# Patient Record
Sex: Female | Born: 1955 | Race: White | Hispanic: No | Marital: Married | Smoking: Never smoker
Health system: Southern US, Community
[De-identification: ages and names within clinical notes are randomized; demographics above are authoritative.]

## PROBLEM LIST (undated history)

## (undated) DIAGNOSIS — I10 Essential (primary) hypertension: Secondary | ICD-10-CM

## (undated) DIAGNOSIS — R7303 Prediabetes: Secondary | ICD-10-CM

## (undated) HISTORY — PX: COLONOSCOPY: SHX174

---

## 2008-05-20 ENCOUNTER — Ambulatory Visit: Payer: Self-pay | Admitting: Nurse Practitioner

## 2009-06-19 ENCOUNTER — Ambulatory Visit: Payer: Self-pay | Admitting: Nurse Practitioner

## 2009-06-24 ENCOUNTER — Emergency Department: Payer: Self-pay | Admitting: Emergency Medicine

## 2009-07-21 ENCOUNTER — Ambulatory Visit: Payer: Self-pay | Admitting: Urology

## 2009-11-03 ENCOUNTER — Ambulatory Visit: Payer: Self-pay | Admitting: Urology

## 2009-11-27 ENCOUNTER — Ambulatory Visit: Payer: Self-pay | Admitting: Urology

## 2009-12-22 ENCOUNTER — Ambulatory Visit: Payer: Self-pay | Admitting: Urology

## 2010-05-12 ENCOUNTER — Ambulatory Visit: Payer: Self-pay | Admitting: General Practice

## 2010-10-08 ENCOUNTER — Ambulatory Visit: Payer: Self-pay | Admitting: Nurse Practitioner

## 2011-12-23 ENCOUNTER — Ambulatory Visit: Payer: Self-pay

## 2012-01-05 ENCOUNTER — Ambulatory Visit: Payer: Self-pay

## 2012-03-29 DIAGNOSIS — M810 Age-related osteoporosis without current pathological fracture: Secondary | ICD-10-CM | POA: Insufficient documentation

## 2013-03-09 ENCOUNTER — Ambulatory Visit: Payer: Self-pay | Admitting: Internal Medicine

## 2014-03-12 ENCOUNTER — Ambulatory Visit: Payer: Self-pay | Admitting: Nurse Practitioner

## 2014-04-02 ENCOUNTER — Ambulatory Visit: Payer: Self-pay | Admitting: Gastroenterology

## 2015-03-19 ENCOUNTER — Ambulatory Visit
Admit: 2015-03-19 | Disposition: A | Discharge status: Home / Self Care | Payer: Self-pay | Attending: Nurse Practitioner | Admitting: Nurse Practitioner

## 2016-01-14 ENCOUNTER — Other Ambulatory Visit: Payer: Self-pay | Admitting: Nurse Practitioner

## 2016-01-14 DIAGNOSIS — Z1231 Encounter for screening mammogram for malignant neoplasm of breast: Secondary | ICD-10-CM

## 2016-03-29 ENCOUNTER — Ambulatory Visit
Admission: RE | Admit: 2016-03-29 | Discharge: 2016-03-29 | Disposition: A | Discharge status: Home / Self Care | Payer: Managed Care, Other (non HMO) | Source: Ambulatory Visit | Attending: Nurse Practitioner | Admitting: Nurse Practitioner

## 2016-03-29 DIAGNOSIS — Z1231 Encounter for screening mammogram for malignant neoplasm of breast: Secondary | ICD-10-CM | POA: Diagnosis present

## 2017-01-19 ENCOUNTER — Encounter: Payer: Self-pay | Admitting: Emergency Medicine

## 2017-01-19 ENCOUNTER — Emergency Department
Admission: EM | Admit: 2017-01-19 | Discharge: 2017-01-19 | Disposition: A | Discharge status: Home / Self Care | Payer: BLUE CROSS/BLUE SHIELD | Attending: Emergency Medicine | Admitting: Emergency Medicine

## 2017-01-19 ENCOUNTER — Emergency Department: Payer: BLUE CROSS/BLUE SHIELD

## 2017-01-19 DIAGNOSIS — R0789 Other chest pain: Secondary | ICD-10-CM | POA: Diagnosis not present

## 2017-01-19 DIAGNOSIS — I10 Essential (primary) hypertension: Secondary | ICD-10-CM | POA: Diagnosis not present

## 2017-01-19 DIAGNOSIS — R079 Chest pain, unspecified: Secondary | ICD-10-CM | POA: Diagnosis present

## 2017-01-19 HISTORY — DX: Essential (primary) hypertension: I10

## 2017-01-19 LAB — BASIC METABOLIC PANEL
ANION GAP: 10 (ref 5–15)
BUN: 18 mg/dL (ref 6–20)
CALCIUM: 9.5 mg/dL (ref 8.9–10.3)
CO2: 25 mmol/L (ref 22–32)
Chloride: 102 mmol/L (ref 101–111)
Creatinine, Ser: 0.79 mg/dL (ref 0.44–1.00)
GLUCOSE: 116 mg/dL — AB (ref 65–99)
POTASSIUM: 3.9 mmol/L (ref 3.5–5.1)
SODIUM: 137 mmol/L (ref 135–145)

## 2017-01-19 LAB — CBC
HEMATOCRIT: 38.5 % (ref 35.0–47.0)
HEMOGLOBIN: 13.4 g/dL (ref 12.0–16.0)
MCH: 30.4 pg (ref 26.0–34.0)
MCHC: 34.9 g/dL (ref 32.0–36.0)
MCV: 87.1 fL (ref 80.0–100.0)
Platelets: 428 10*3/uL (ref 150–440)
RBC: 4.42 MIL/uL (ref 3.80–5.20)
RDW: 13.3 % (ref 11.5–14.5)
WBC: 9.2 10*3/uL (ref 3.6–11.0)

## 2017-01-19 LAB — TROPONIN I

## 2017-01-19 MED ORDER — ASPIRIN 81 MG PO CHEW
CHEWABLE_TABLET | ORAL | Status: AC
  Filled 2017-01-19: qty 4

## 2017-01-19 MED ORDER — ASPIRIN 81 MG PO CHEW
324.0000 mg | CHEWABLE_TABLET | Freq: Once | ORAL | Status: AC
  Administered 2017-01-19: 324 mg via ORAL

## 2017-01-19 NOTE — ED Triage Notes (Signed)
Pt presents to ED with c/o L sided chest "heaviness that radiates to her neck" x 1 week. Pt also c/o hypertension at this time. NAD noted, pt is alert and oriented, respirations even and unlabored, pt is noted to be hypertensive on arrival.

## 2017-01-19 NOTE — ED Provider Notes (Signed)
[facility] Emergency Department Provider Note  ____________________________________________   First MD Initiated Contact with Patient 01/19/17 2118     (approximate)  I have reviewed the triage vital signs and the nursing notes.   HISTORY  Chief Complaint Chest Pain    HPI Jamie Griffith is a 62 y.o. female with a medical history only of hypertension which is generally very well controlled and who is quite healthy and fit for her age who presents for evaluation of approximately one week of intermittent nonradiating central chest pain with concurrent elevated blood pressure.  She states that she cannot determine a particular pattern to the chest pain.  In fact a couple of days ago she had a strenuous workout and did not experience any chest pain.  She noticed it today about 45 minutes after eating a relatively spicy lunch.  She notes that the pain is not reproducible and is not exacerbated by movement, but she can actually make the pain go away by pressing firmly on the center of her sternum.  She denies fever/chills, shortness of breath, nausea, vomiting, abdominal pain, dysuria.  She has not had any problems with her heart in the past and does not have a cardiologist.  She has no first degree relatives who have had heart attacks.  She has no history of hyper-dyslipidemia or diabetes.  She has not been on any long trips recently and has never had any blood clots in her legs or lungs and has no personal history of cancer.  She is extremely worried about her blood pressure because she will check her blood pressure when she is having the chest pain and notes that it is been extremely high for her, in the range of the 180s over 90s, when typically her systolic blood pressure is around 115.  Past Medical History:  Diagnosis Date  . HTN (hypertension)     There are no active problems to display for this patient.   Past Surgical History:  Procedure Laterality Date    . CESAREAN SECTION      Prior to Admission medications   Not on File    Allergies Patient has no known allergies.  No family history on file.  Social History Social History  Substance Use Topics  . Smoking status: Never Smoker  . Smokeless tobacco: Never Used  . Alcohol use Yes     Comment: Occ, socially    Review of Systems Constitutional: No fever/chills Eyes: No visual changes. ENT: No sore throat. Cardiovascular: Central anterior chest pain. Respiratory: Denies shortness of breath. Gastrointestinal: No abdominal pain.  No nausea, no vomiting.  No diarrhea.  No constipation. Genitourinary: Negative for dysuria. Musculoskeletal: Negative for back pain. Skin: Negative for rash. Neurological: Negative for headaches, focal weakness or numbness.  10-point ROS otherwise negative.  ____________________________________________   PHYSICAL EXAM:  VITAL SIGNS: ED Triage Vitals  Enc Vitals Group     BP 01/19/17 1736 (!) 180/91     Pulse Rate 01/19/17 1736 97     Resp 01/19/17 1736 18     Temp 01/19/17 1736 98.7 F (37.1 C)     Temp Source 01/19/17 1736 Oral     SpO2 01/19/17 1736 100 %     Weight 01/19/17 1736 110 lb (49.9 kg)     Height 01/19/17 1736 5\' 4"  (1.626 m)     Head Circumference --      Peak Flow --      Pain Score 01/19/17 1737 7  Pain Loc --      Pain Edu? --      Excl. in GC? --     Constitutional: Alert and oriented. Well appearing and in no acute distress. Eyes: Conjunctivae are normal. PERRL. EOMI. Head: Atraumatic. Nose: No congestion/rhinnorhea. Mouth/Throat: Mucous membranes are moist.  Oropharynx non-erythematous. Neck: No stridor.  No meningeal signs.   Cardiovascular: Normal rate, regular rhythm. Good peripheral circulation. Grossly normal heart sounds. Respiratory: Normal respiratory effort.  No retractions. Lungs CTAB. Gastrointestinal: Soft and nontender. No distention.  Musculoskeletal: No lower extremity tenderness nor  edema. No gross deformities of extremities. Neurologic:  Normal speech and language. No gross focal neurologic deficits are appreciated.  Skin:  Skin is warm, dry and intact. No rash noted. Psychiatric: Mood and affect are normal. Speech and behavior are normal.  ____________________________________________   LABS (all labs ordered are listed, but only abnormal results are displayed)  Labs Reviewed  BASIC METABOLIC PANEL - Abnormal; Notable for the following:       Result Value   Glucose, Bld 116 (*)    All other components within normal limits  CBC  TROPONIN I  TROPONIN I   ____________________________________________  EKG  ED ECG REPORT I, Lylie Blacklock, the attending physician, personally viewed and interpreted this ECG.  Date: 01/19/2017 EKG Time: 17:30 Rate: 90 Rhythm: normal sinus rhythm QRS Axis: normal Intervals: normal ST/T Wave abnormalities: Prominent T waves but not indicative of acute ischemia.  No ST segment disturbances.   Conduction Disturbances: none Narrative Interpretation: Non-specific ST segment / T-wave changes, but no evidence of acute ischemia.  ____________________________________________  RADIOLOGY   Dg Chest 2 View  Result Date: 01/19/2017 CLINICAL DATA:  Left-sided chest heaviness, radiating to the neck. Duration 1 week. Hypertension. EXAM: CHEST  2 VIEW COMPARISON:  None. FINDINGS: Mild hyperinflation. The lungs are clear. The pulmonary vasculature is normal. There is no pleural effusion. Heart size is normal. Hilar and mediastinal contours are unremarkable. IMPRESSION: Hyperinflation.  No acute cardiopulmonary findings. Electronically Signed   By: Ellery Plunk M.D.   On: 01/19/2017 18:19    ____________________________________________   PROCEDURES  Procedure(s) performed:   Procedures   Critical Care performed: No ____________________________________________   INITIAL IMPRESSION / ASSESSMENT AND PLAN / ED  COURSE  Pertinent labs & imaging results that were available during my care of the patient were reviewed by me and considered in my medical decision making (see chart for details).  HEART score 3 (low risk), Wells Score for PE is zero.  She had 2 negative troponins.  Her pain completely resolves when placing pressure on her sternum, which is not exactly indicative of musculoskeletal discomfort but is also reassuring in terms of making an acute or emergent, potentially life-threatening medical condition less likely.  She had no pain during a strenuous workout a couple of days ago, and she and her husband both mentioned that her pain started about 45 minutes after eating lunch today.  She has no tenderness to palpation of the right upper quadrant, no nausea or vomiting, and I very much doubt gallbladder disease.  I had an extensive discussion with the patient and her husband about her workup, the role that blood pressure may play (or the fact that the chest pain may be driving up her blood pressure), and why I do not feel that blood pressure intervention is appropriate in the emergency department setting.  I explained that close outpatient cardiology follow-up is appropriate for relatively low risk patients and that  close follow[place]up is very possible in [place].  Her husband is a patient of Dr. Arnoldo HookerBruce Kowalski and they would like to follow up with him, but I also provided the name and number of Dr. Welton FlakesKhan who is frequently able to see patients within 24-48 hours.  I gave my usual and customary return precautions and provided a full dose aspirin.  The patient understands and agrees with the plan.  There is no evidence of acute or emergent medical condition at this time with a low risk of significant morbidity/mortality until she can follow-up as an outpatient.     ____________________________________________  FINAL CLINICAL IMPRESSION(S) / ED DIAGNOSES  Final diagnoses:  Atypical chest pain      MEDICATIONS GIVEN DURING THIS VISIT:  Medications  aspirin 81 MG chewable tablet (not administered)  aspirin chewable tablet 324 mg (324 mg Oral Given 01/19/17 2256)     NEW OUTPATIENT MEDICATIONS STARTED DURING THIS VISIT:  There are no discharge medications for this patient.   There are no discharge medications for this patient.   There are no discharge medications for this patient.    Note:  This document was prepared using Dragon voice recognition software and may include unintentional dictation errors.    Loleta Roseory Ryun Velez, MD 01/19/17 [phone]

## 2017-01-19 NOTE — Discharge Instructions (Signed)

## 2017-01-19 NOTE — ED Notes (Signed)
Pt reports she has had intermittent central chest pain and high BP for over 1 week, Hx of hypertension.

## 2017-02-15 ENCOUNTER — Other Ambulatory Visit: Payer: Self-pay | Admitting: Nurse Practitioner

## 2017-02-15 DIAGNOSIS — Z1231 Encounter for screening mammogram for malignant neoplasm of breast: Secondary | ICD-10-CM

## 2017-03-31 ENCOUNTER — Other Ambulatory Visit: Payer: Self-pay | Admitting: Nurse Practitioner

## 2017-03-31 ENCOUNTER — Ambulatory Visit
Admission: RE | Admit: 2017-03-31 | Discharge: 2017-03-31 | Disposition: A | Discharge status: Home / Self Care | Payer: BLUE CROSS/BLUE SHIELD | Source: Ambulatory Visit | Attending: Nurse Practitioner | Admitting: Nurse Practitioner

## 2017-03-31 DIAGNOSIS — Z1231 Encounter for screening mammogram for malignant neoplasm of breast: Secondary | ICD-10-CM | POA: Diagnosis present

## 2018-02-20 ENCOUNTER — Other Ambulatory Visit: Payer: Self-pay | Admitting: Nurse Practitioner

## 2018-02-20 DIAGNOSIS — Z1231 Encounter for screening mammogram for malignant neoplasm of breast: Secondary | ICD-10-CM

## 2018-04-03 ENCOUNTER — Ambulatory Visit: Payer: BLUE CROSS/BLUE SHIELD

## 2018-04-10 ENCOUNTER — Ambulatory Visit
Admission: RE | Admit: 2018-04-10 | Discharge: 2018-04-10 | Disposition: A | Discharge status: Home / Self Care | Payer: BLUE CROSS/BLUE SHIELD | Source: Ambulatory Visit | Attending: Nurse Practitioner | Admitting: Nurse Practitioner

## 2018-04-10 DIAGNOSIS — Z1231 Encounter for screening mammogram for malignant neoplasm of breast: Secondary | ICD-10-CM

## 2019-03-06 ENCOUNTER — Other Ambulatory Visit: Payer: Self-pay | Admitting: Nurse Practitioner

## 2019-03-06 DIAGNOSIS — Z1231 Encounter for screening mammogram for malignant neoplasm of breast: Secondary | ICD-10-CM

## 2019-06-25 ENCOUNTER — Ambulatory Visit
Admission: RE | Admit: 2019-06-25 | Discharge: 2019-06-25 | Disposition: A | Discharge status: Home / Self Care | Payer: BLUE CROSS/BLUE SHIELD | Source: Ambulatory Visit | Attending: Nurse Practitioner | Admitting: Nurse Practitioner

## 2019-06-25 DIAGNOSIS — Z1231 Encounter for screening mammogram for malignant neoplasm of breast: Secondary | ICD-10-CM | POA: Insufficient documentation

## 2020-04-08 ENCOUNTER — Other Ambulatory Visit: Payer: Self-pay | Admitting: Nurse Practitioner

## 2020-04-08 DIAGNOSIS — Z1231 Encounter for screening mammogram for malignant neoplasm of breast: Secondary | ICD-10-CM

## 2020-08-11 ENCOUNTER — Other Ambulatory Visit: Payer: Self-pay

## 2020-08-11 ENCOUNTER — Ambulatory Visit
Admission: RE | Admit: 2020-08-11 | Discharge: 2020-08-11 | Disposition: A | Discharge status: Home / Self Care | Payer: Medicare Other | Source: Ambulatory Visit | Attending: Nurse Practitioner | Admitting: Nurse Practitioner

## 2020-08-11 DIAGNOSIS — Z1231 Encounter for screening mammogram for malignant neoplasm of breast: Secondary | ICD-10-CM | POA: Diagnosis not present

## 2020-10-06 ENCOUNTER — Ambulatory Visit
Admission: EM | Admit: 2020-10-06 | Discharge: 2020-10-06 | Disposition: A | Discharge status: Home / Self Care | Payer: Medicare Other

## 2020-10-06 ENCOUNTER — Other Ambulatory Visit: Payer: Self-pay

## 2020-10-06 ENCOUNTER — Encounter: Payer: Self-pay | Admitting: Emergency Medicine

## 2020-10-06 DIAGNOSIS — J069 Acute upper respiratory infection, unspecified: Secondary | ICD-10-CM | POA: Diagnosis not present

## 2020-10-06 MED ORDER — BENZONATATE 100 MG PO CAPS: 200.0000 mg | ORAL_CAPSULE | Freq: Three times a day (TID) | ORAL | 0 refills | Status: DC

## 2020-10-06 MED ORDER — PROMETHAZINE-DM 6.25-15 MG/5ML PO SYRP: 5.0000 mL | ORAL_SOLUTION | Freq: Four times a day (QID) | ORAL | 0 refills | Status: DC | PRN

## 2020-10-06 NOTE — ED Provider Notes (Signed)
[facility]    CSN: [id number] Arrival date & time: 10/06/20  0904      History   Chief Complaint Chief Complaint  Patient presents with  . Nasal Congestion  . Headache  . Cough    HPI Jamie Griffith is a 64 y.o. female.    64 year old female presents for evaluation of nasal congestion, headache, and cough x3 days. She denies fever, nasal discharge, sore throat, shortness of breath or wheezing, nausea, vomiting, diarrhea, sick contacts, or positive Covid exposure. She does endorse sinus pressure and sinus headache, ear pressure, and postnasal drip. She is vaccinated against Covid with the full visor series. 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 She takes Zyrtec daily for allergies.     Past Medical History:  Diagnosis Date  . HTN (hypertension)     There are no problems to display for this patient.   Past Surgical History:  Procedure Laterality Date  . CESAREAN SECTION      OB History   No obstetric history on file.      Home Medications    Prior to Admission medications   Medication Sig Start Date End Date Taking? Authorizing Provider  benzonatate (TESSALON) 100 MG capsule Take 2 capsules (200 mg total) by mouth every 8 (eight) hours. 10/06/20   Margarette Canada, NP  losartan-hydrochlorothiazide (HYZAAR) 100-25 MG tablet Take 1 tablet by mouth daily. 09/25/20   [provider]  promethazine-dextromethorphan (PROMETHAZINE-DM) 6.25-15 MG/5ML syrup Take 5 mLs by mouth 4 (four) times daily as needed for cough. 10/06/20   Margarette Canada, NP    Family History Family History  Problem Relation Age of Onset  . Breast cancer Neg Hx     Social History Social History   Tobacco Use  . Smoking status: Never Smoker  . Smokeless tobacco: Never Used  Substance Use Topics  . Alcohol use: Yes    Comment: Occ, socially  . Drug use: No     Allergies   Patient has no known allergies.   Review  of Systems Review of Systems  Constitutional: Negative for activity change, appetite change and fever.  HENT: Positive for congestion, ear pain, sinus pressure and sinus pain. Negative for rhinorrhea, sore throat and tinnitus.   Respiratory: Positive for cough. Negative for shortness of breath and wheezing.   Cardiovascular: Negative for chest pain.  Gastrointestinal: Negative for abdominal pain, diarrhea, nausea and vomiting.  Genitourinary: Negative for dysuria and frequency.  Musculoskeletal: Negative for arthralgias and myalgias.  Skin: Negative for rash.  Neurological: Positive for headaches. Negative for syncope.  Hematological: Negative for adenopathy. Does not bruise/bleed easily.  Psychiatric/Behavioral: Negative.      Physical Exam Triage Vital Signs ED Triage Vitals  Enc Vitals Group     BP 10/06/20 0940 138/87     Pulse Rate 10/06/20 0940 96     Resp 10/06/20 0940 18     Temp 10/06/20 0940 98.8 F (37.1 C)     Temp Source 10/06/20 0940 Oral     SpO2 10/06/20 0940 98 %     Weight 10/06/20 0939 115 lb (52.2 kg)     Height 10/06/20 0939 _0  (1.575 m)     Head Circumference --      Peak Flow --      Pain Score 10/06/20 0938 6     Pain Loc --      Pain Edu? --  Excl. in GC? --    No data found.  Updated Vital Signs BP 138/87 (BP Location: Right Arm)   Pulse 96   Temp 98.8 F (37.1 C) (Oral)   Resp 18   Ht _0  (1.575 m)   Wt 115 lb (52.2 kg)   SpO2 98%   BMI 21.03 kg/m   Visual Acuity Right Eye Distance:   Left Eye Distance:   Bilateral Distance:    Right Eye Near:   Left Eye Near:    Bilateral Near:     Physical Exam Vitals and nursing note reviewed.  Constitutional:      General: She is not in acute distress.    Appearance: She is well-developed and normal weight.  HENT:     Head: Normocephalic and atraumatic.     Right Ear: Hearing, tympanic membrane, ear canal and external ear normal. Tympanic membrane is not injected, perforated or  erythematous.     Left Ear: Hearing, tympanic membrane, ear canal and external ear normal. Tympanic membrane is not injected, perforated or erythematous.     Nose:     Right Turbinates: Swollen. Not pale.     Left Turbinates: Swollen. Not pale.     Right Sinus: No maxillary sinus tenderness or frontal sinus tenderness.     Left Sinus: No maxillary sinus tenderness or frontal sinus tenderness.     Comments: Nasal mucosa reveals mild erythema and edema. Scant clear nasal discharge. No pus noted. No tenderness to percussion of frontal or maxillary sinuses bilaterally.    Mouth/Throat:     Mouth: Mucous membranes are moist.     Comments: Posterior oropharynx is mildly erythematous without injection. There is clear postnasal drip. Eyes:     General: No scleral icterus.    Extraocular Movements: Extraocular movements intact.     Right eye: Normal extraocular motion.     Pupils: Pupils are equal, round, and reactive to light. Pupils are equal.  Cardiovascular:     Rate and Rhythm: Normal rate and regular rhythm.     Heart sounds: Normal heart sounds. No murmur heard.  No gallop.   Pulmonary:     Effort: Pulmonary effort is normal. No respiratory distress.     Breath sounds: Normal breath sounds. No wheezing, rhonchi or rales.  Musculoskeletal:        General: No swelling or tenderness. Normal range of motion.     Cervical back: Normal range of motion and neck supple.  Lymphadenopathy:     Cervical: No cervical adenopathy.  Skin:    General: Skin is warm and dry.     Capillary Refill: Capillary refill takes less than 2 seconds.     Findings: No erythema or rash.  Neurological:     Mental Status: She is alert and oriented to person, place, and time.     GCS: GCS eye subscore is 4. GCS motor subscore is 6.     Cranial Nerves: No cranial nerve deficit.  Psychiatric:        Mood and Affect: Mood normal.        Speech: Speech normal.        Behavior: Behavior normal.      UC Treatments  / Results  Labs (all labs ordered are listed, but only abnormal results are displayed) Labs Reviewed - No data to display  EKG   Radiology No results found.  Procedures Procedures (including critical care time)  Medications Ordered in UC Medications - No data to display  Initial Impression / Assessment and Plan / UC Course  I have reviewed the triage vital signs and the nursing notes.  Pertinent labs & imaging results that were available during my care of the patient were reviewed by me and considered in my medical decision making (see chart for details).   Patient here for evaluation of nasal congestion, frontal/ethmoid headache, and dry cough x3 days. She has had no fever, change in taste or smell, body aches, or other Covid-like symptoms. Patient reports that her symptoms started after she was messing with straw, which she is allergic to.  Physical exam of the nasal mucosa reveals mild erythema and edema. No bluish hue or pale mucous membranes that would be more consistent with allergic rhinitis. No purulent discharge noted in the nares or turbinates.  Will DC home with Tessalon Perles and Promethazine DM for cough, have patient continue her antihistamine, add sinus irrigation to her regimen.  Final Clinical Impressions(s) / UC Diagnoses   Final diagnoses:  Viral URI with cough     Discharge Instructions     Continue your home antihistamine.   When using your Flonase, follow each 2 actuations per nostril with 1 spray of saline nasal spray. This will help to push the particles up to your turbinates where they will take effect.  Perform sinus irrigation with a [employer] sinus rinse kit and warmed distilled water. Warm distilled water in a sauce pan and tested to the back of your wrist. The water should be warm and should not sting. Perform the sinus irrigation 2-3 times a day. Wait 20 minutes after doing sinus irrigation in the evening before using your nasal steroid.  Use  the Tessalon Perles every 8 hours during the day as needed for cough. Take them with a small sip of water. He experienced some numbness to the base of the tongue or a metallic taste in her mouth this is normal. Use the promethazine DM syrup at bedtime for cough and congestion.  Use over-the-counter Tylenol and ibuprofen as needed for pain and fever.    ED Prescriptions    Medication Sig Dispense Auth. Provider   benzonatate (TESSALON) 100 MG capsule Take 2 capsules (200 mg total) by mouth every 8 (eight) hours. 21 capsule Margarette Canada, NP   promethazine-dextromethorphan (PROMETHAZINE-DM) 6.25-15 MG/5ML syrup Take 5 mLs by mouth 4 (four) times daily as needed for cough. 118 mL Margarette Canada, NP     PDMP not reviewed this encounter.   Margarette Canada, NP 10/06/20 1016

## 2020-10-06 NOTE — Discharge Instructions (Addendum)
Continue your home antihistamine.   When using your Flonase, follow each 2 actuations per nostril with 1 spray of saline nasal spray. This will help to push the particles up to your turbinates where they will take effect.  Perform sinus irrigation with a [employer] sinus rinse kit and warmed distilled water. Warm distilled water in a sauce pan and tested to the back of your wrist. The water should be warm and should not sting. Perform the sinus irrigation 2-3 times a day. Wait 20 minutes after doing sinus irrigation in the evening before using your nasal steroid.  Use the Tessalon Perles every 8 hours during the day as needed for cough. Take them with a small sip of water. He experienced some numbness to the base of the tongue or a metallic taste in her mouth this is normal. Use the promethazine DM syrup at bedtime for cough and congestion.  Use over-the-counter Tylenol and ibuprofen as needed for pain and fever.

## 2020-10-06 NOTE — ED Triage Notes (Signed)
Patient c/o nasal congestion, headaches, cough that started 3 days ago.

## 2021-04-23 ENCOUNTER — Other Ambulatory Visit: Payer: Self-pay | Admitting: Nurse Practitioner

## 2021-04-23 DIAGNOSIS — Z1231 Encounter for screening mammogram for malignant neoplasm of breast: Secondary | ICD-10-CM

## 2021-07-19 ENCOUNTER — Ambulatory Visit
Admission: EM | Admit: 2021-07-19 | Discharge: 2021-07-19 | Disposition: A | Discharge status: Home / Self Care | Payer: Medicare Other | Attending: Orthopedic Surgery | Admitting: Orthopedic Surgery

## 2021-07-19 ENCOUNTER — Other Ambulatory Visit: Payer: Self-pay

## 2021-07-19 DIAGNOSIS — N3000 Acute cystitis without hematuria: Secondary | ICD-10-CM | POA: Diagnosis present

## 2021-07-19 LAB — POCT URINALYSIS DIP (DEVICE)
Bilirubin Urine: NEGATIVE
Glucose, UA: NEGATIVE mg/dL
Hgb urine dipstick: NEGATIVE
Ketones, ur: NEGATIVE mg/dL
Nitrite: NEGATIVE
Protein, ur: NEGATIVE mg/dL
Specific Gravity, Urine: 1.02 (ref 1.005–1.030)
Urobilinogen, UA: 0.2 mg/dL (ref 0.0–1.0)
pH: 7.5 (ref 5.0–8.0)

## 2021-07-19 MED ORDER — CEPHALEXIN 500 MG PO CAPS: 500.0000 mg | ORAL_CAPSULE | Freq: Four times a day (QID) | ORAL | 0 refills | Status: AC

## 2021-07-19 NOTE — ED Triage Notes (Signed)
Patient states that she has been having sharp left lower sided back pain x 2 days. States that she has also noticed bloating. Reports that she has a history of kidney stones and this feels similar.

## 2021-07-19 NOTE — ED Provider Notes (Signed)
[facility]    CSN: [id number] Arrival date & time: 07/19/21  1128      History   Chief Complaint Chief Complaint  Patient presents with   Back Pain    Left lower    HPI Jamie Griffith is a 65 y.o. female presents to the urgent care facility for evaluation of left flank pain, bloating sensation.  Symptoms have been present for 2 days.  No trauma or injury.  She initially thought it was muscular back pain but she does not feel any spasms, tightness or tenderness to touch.  She use muscle creams with no relief.  She has noticed decreased urinary frequency but no burning or hematuria.  She denies any abdominal pain but does have some bloating.  She is passing some gas.  Bowels have been regular.  No nausea, fevers, vomiting.  She does have a history of kidney stones.  HPI  Past Medical History:  Diagnosis Date   HTN (hypertension)     There are no problems to display for this patient.   Past Surgical History:  Procedure Laterality Date   CESAREAN SECTION      OB History   No obstetric history on file.      Home Medications    Prior to Admission medications   Medication Sig Start Date End Date Taking? Authorizing Provider  cephALEXin (KEFLEX) 500 MG capsule Take 1 capsule (500 mg total) by mouth 4 (four) times daily for 7 days. 07/19/21 07/26/21 Yes Evon Slack, PA-C  losartan-hydrochlorothiazide (HYZAAR) 100-25 MG tablet Take 1 tablet by mouth daily. 09/25/20  Yes [provider]  benzonatate (TESSALON) 100 MG capsule Take 2 capsules (200 mg total) by mouth every 8 (eight) hours. 10/06/20   Becky Augusta, NP  promethazine-dextromethorphan (PROMETHAZINE-DM) 6.25-15 MG/5ML syrup Take 5 mLs by mouth 4 (four) times daily as needed for cough. 10/06/20   Becky Augusta, NP    Family History Family History  Problem Relation Age of Onset   Breast cancer Neg Hx     Social History Social History   Tobacco Use   Smoking status: Never   Smokeless  tobacco: Never  Substance Use Topics   Alcohol use: Yes    Comment: Occ, socially   Drug use: No     Allergies   Patient has no known allergies.   Review of Systems Review of Systems  Constitutional:  Negative for fever.  Respiratory:  Negative for shortness of breath.   Cardiovascular:  Negative for chest pain.  Gastrointestinal:  Negative for abdominal pain, nausea and vomiting.  Genitourinary:  Positive for flank pain. Negative for difficulty urinating, dysuria, frequency and urgency.  Musculoskeletal:  Negative for back pain and myalgias.  Skin:  Negative for rash.  Neurological:  Negative for dizziness and headaches.    Physical Exam Triage Vital Signs ED Triage Vitals  Enc Vitals Group     BP 07/19/21 1138 (!) 151/66     Pulse Rate 07/19/21 1138 99     Resp 07/19/21 1138 18     Temp 07/19/21 1138 99.2 F (37.3 C)     Temp Source 07/19/21 1138 Oral     SpO2 07/19/21 1138 100 %     Weight 07/19/21 1137 112 lb (50.8 kg)     Height 07/19/21 1137 5\' 2"  (1.575 m)     Head Circumference --      Peak Flow --      Pain Score 07/19/21 1137 4  Pain Loc --      Pain Edu? --      Excl. in GC? --    No data found.  Updated Vital Signs BP (!) 151/66 (BP Location: Right Arm)   Pulse 99   Temp 99.2 F (37.3 C) (Oral)   Resp 18   Ht 5\' 2"  (1.575 m)   Wt 112 lb (50.8 kg)   SpO2 100%   BMI 20.49 kg/m   Visual Acuity Right Eye Distance:   Left Eye Distance:   Bilateral Distance:    Right Eye Near:   Left Eye Near:    Bilateral Near:     Physical Exam Constitutional:      Appearance: She is well-developed.  HENT:     Head: Normocephalic and atraumatic.  Eyes:     Conjunctiva/sclera: Conjunctivae normal.  Cardiovascular:     Rate and Rhythm: Normal rate.     Pulses: Normal pulses.     Heart sounds: Normal heart sounds.  Pulmonary:     Effort: Pulmonary effort is normal. No respiratory distress.  Abdominal:     General: Abdomen is flat. There is no  distension.     Palpations: Abdomen is soft. There is no mass.     Tenderness: There is no abdominal tenderness. There is left CVA tenderness. There is no guarding or rebound.     Hernia: No hernia is present.     Comments: Mild left CVA tenderness with percussion.  No muscular tenderness and no spinous process tenderness, facet or sacral tenderness.  No muscle spasms noted.  Musculoskeletal:        General: Normal range of motion.     Cervical back: Normal range of motion.  Skin:    General: Skin is warm.     Findings: No rash.  Neurological:     Mental Status: She is alert and oriented to person, place, and time.  Psychiatric:        Behavior: Behavior normal.        Thought Content: Thought content normal.     UC Treatments / Results  Labs (all labs ordered are listed, but only abnormal results are displayed) Labs Reviewed  POCT URINALYSIS DIP (DEVICE) - Abnormal; Notable for the following components:      Result Value   Leukocytes,Ua TRACE (*)    All other components within normal limits  URINE CULTURE  POCT URINALYSIS DIPSTICK, ED / UC    EKG   Radiology No results found.  Procedures Procedures (including critical care time)  Medications Ordered in UC Medications - No data to display  Initial Impression / Assessment and Plan / UC Course  I have reviewed the triage vital signs and the nursing notes.  Pertinent labs & imaging results that were available during my care of the patient were reviewed by me and considered in my medical decision making (see chart for details).     65 year old female with left lower back pain and pelvic pressure/bloating.  Vital signs are stable, slight low-grade temp.  No nausea, vomiting.  Pain does not seem to mimic her previous kidney stones.  No blood in urine.  Leukocytes found and dipstick urinalysis concerning for possible UTI.  Will place on Keflex and.  Urine culture pending.  Patient understands signs symptoms return to the ER  for. Final Clinical Impressions(s) / UC Diagnoses   Final diagnoses:  Acute cystitis without hematuria     Discharge Instructions      Take antibiotics as  prescribed.  Make sure you are drinking lots of fluids.  Return to the ER for any worsening symptoms or changes in health.     ED Prescriptions     Medication Sig Dispense Auth. Provider   cephALEXin (KEFLEX) 500 MG capsule Take 1 capsule (500 mg total) by mouth 4 (four) times daily for 7 days. 28 capsule Evon Slack, PA-C      PDMP not reviewed this encounter.   Evon Slack, [place] 07/19/21 1207

## 2021-07-19 NOTE — Discharge Instructions (Addendum)
Take antibiotics as prescribed.  Make sure you are drinking lots of fluids.  Return to the ER for any worsening symptoms or changes in health.

## 2021-07-21 LAB — URINE CULTURE
Culture: <10000 — AB
Special Requests: NORMAL

## 2021-08-27 ENCOUNTER — Ambulatory Visit
Admission: RE | Admit: 2021-08-27 | Discharge: 2021-08-27 | Disposition: A | Discharge status: Home / Self Care | Payer: Medicare Other | Source: Ambulatory Visit | Attending: Nurse Practitioner | Admitting: Nurse Practitioner

## 2021-08-27 ENCOUNTER — Other Ambulatory Visit: Payer: Self-pay

## 2021-08-27 DIAGNOSIS — Z1231 Encounter for screening mammogram for malignant neoplasm of breast: Secondary | ICD-10-CM | POA: Insufficient documentation

## 2022-02-16 IMAGING — MG MM DIGITAL SCREENING BILAT W/ TOMO AND CAD
6 of 10 series · 6 of 30 positions shown · non-contrast
Comparison: Previous exam(s).

CLINICAL DATA: Screening.

EXAM:
DIGITAL SCREENING BILATERAL MAMMOGRAM WITH TOMOSYNTHESIS AND CAD
TECHNIQUE: Bilateral screening digital craniocaudal and mediolateral oblique
mammograms were obtained. Bilateral screening digital breast
tomosynthesis was performed. The images were evaluated with
computer-aided detection.

[L MLO synth-2D (1 of 2)]
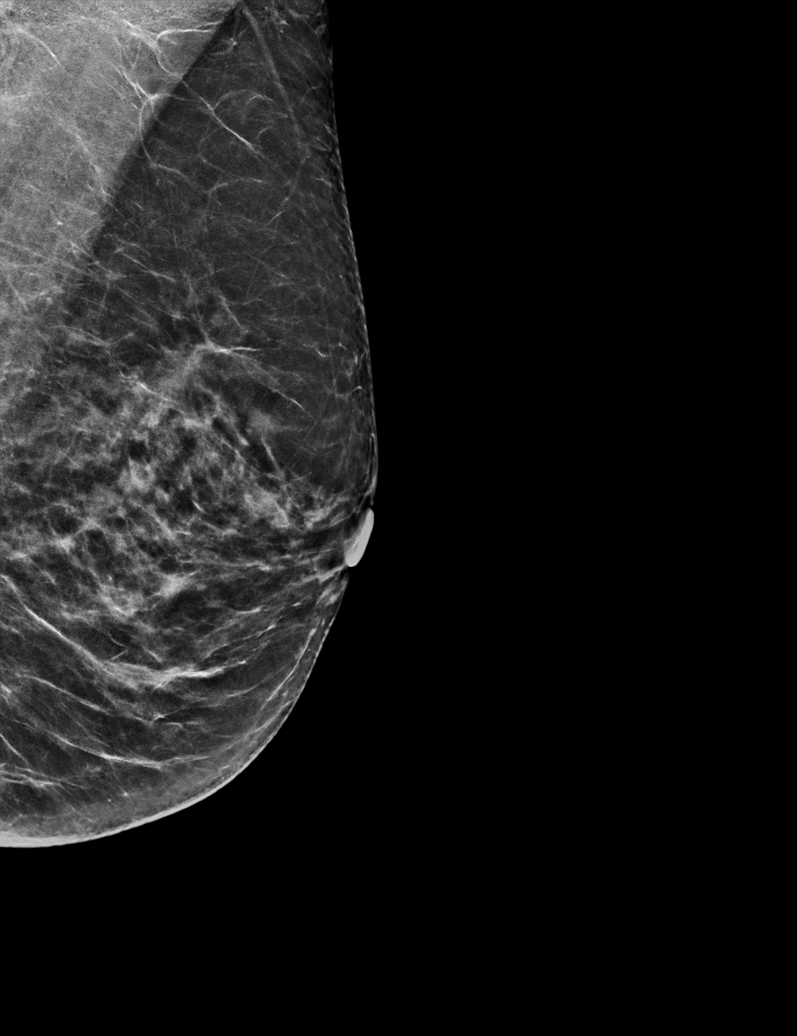

[R CC synth-2D]
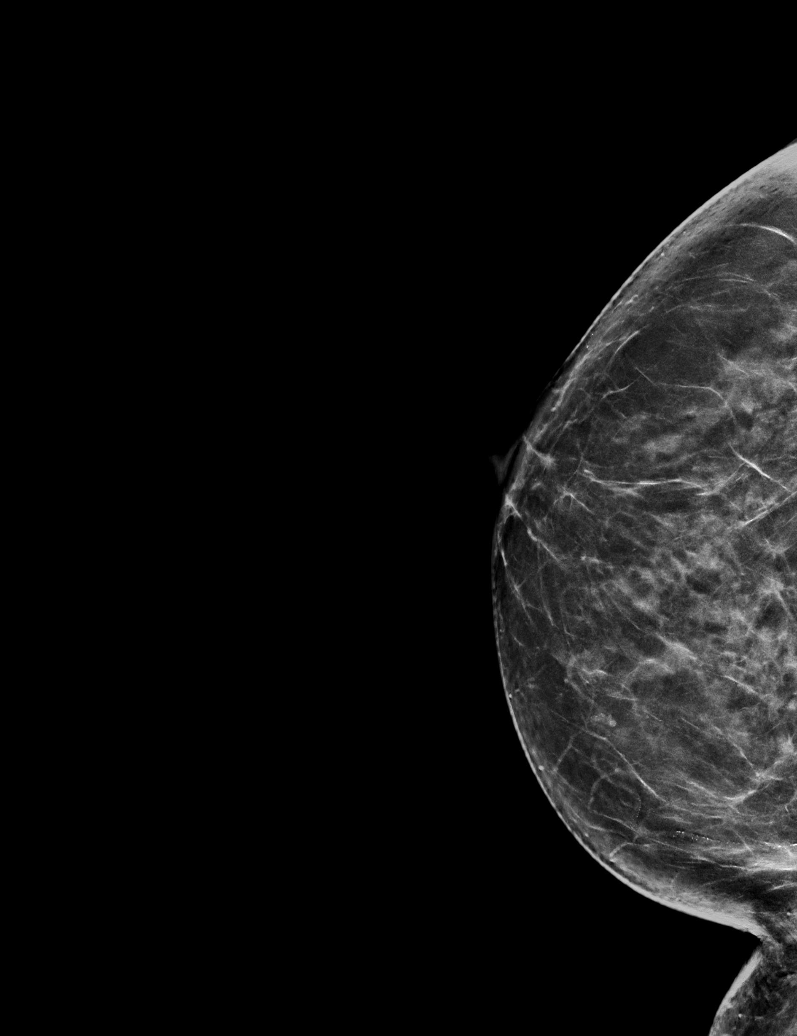

[R MLO synth-2D]
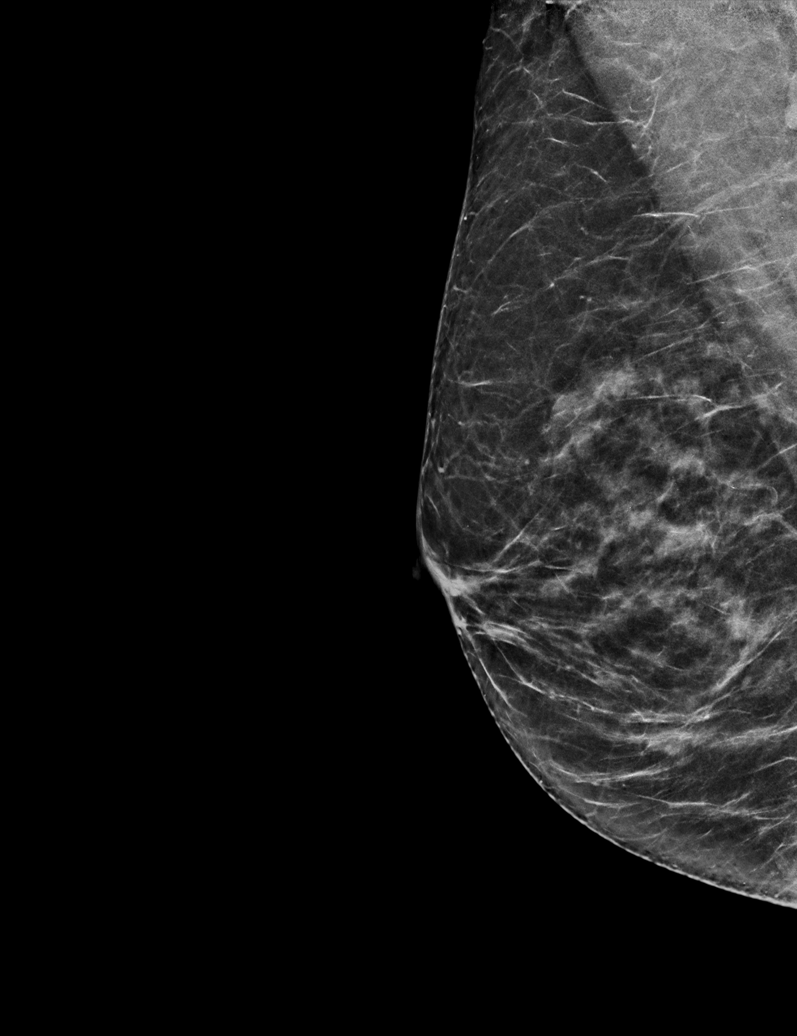

[L MLO synth-2D (2 of 2)]
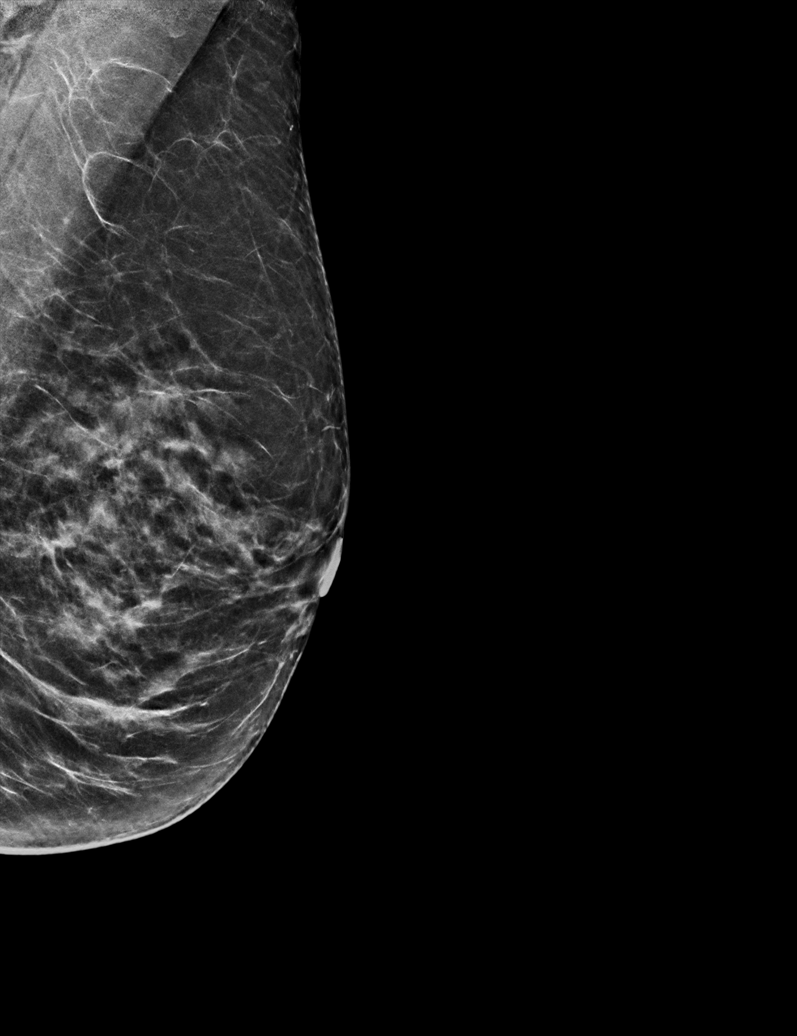

[L CC synth-2D]
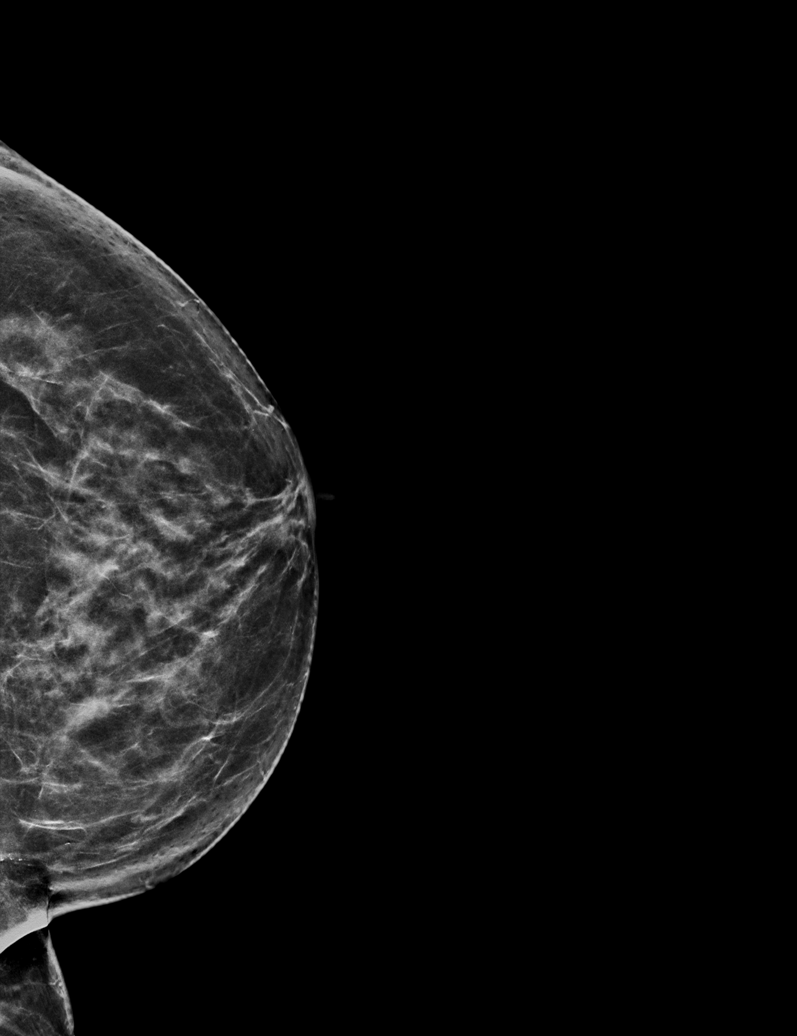

[L MLO tomo · tomo slice 29/58.0]
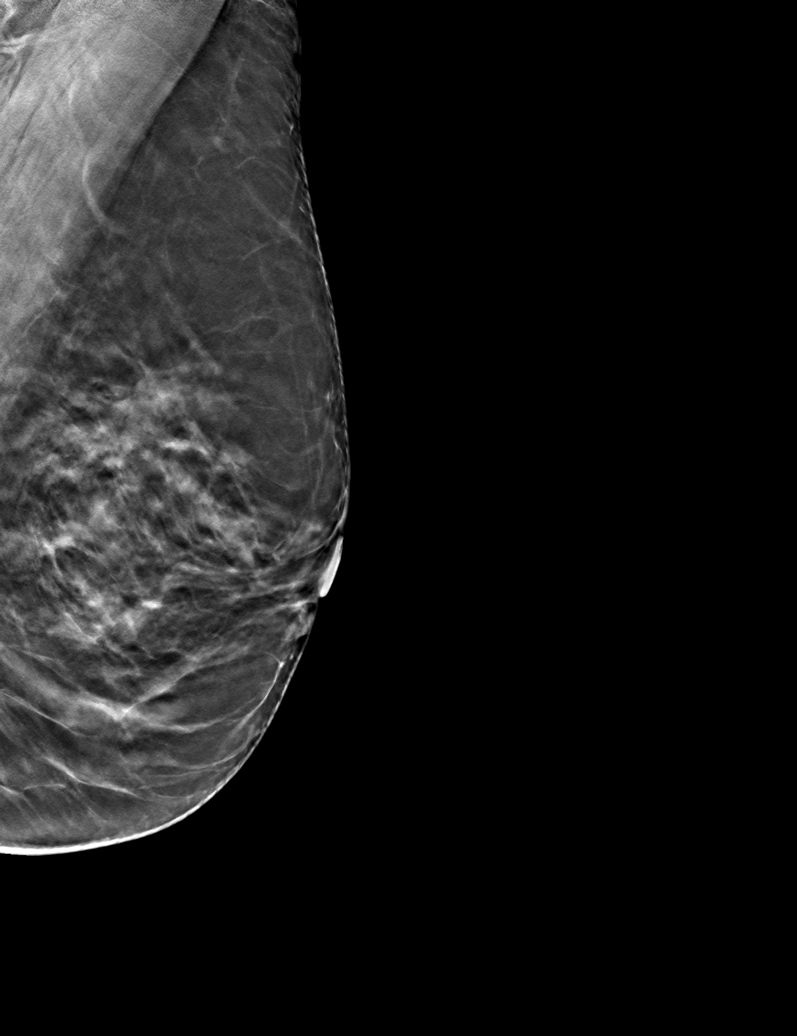

[6 of 30 positions shown; findings below may reference images not displayed]

ACR Breast Density Category c: The breast tissue is heterogeneously
dense, which may obscure small masses.
FINDINGS: There are no findings suspicious for malignancy.
IMPRESSION: No mammographic evidence of malignancy. A result letter of this
screening mammogram will be mailed directly to the patient.

RECOMMENDATION:
Screening mammogram in one year. (Code:[id number]-W-BC3)

BI-RADS CATEGORY  1: Negative.

## 2022-07-19 ENCOUNTER — Other Ambulatory Visit: Payer: Self-pay | Admitting: Nurse Practitioner

## 2022-07-19 DIAGNOSIS — Z1231 Encounter for screening mammogram for malignant neoplasm of breast: Secondary | ICD-10-CM

## 2022-08-27 ENCOUNTER — Other Ambulatory Visit: Payer: Self-pay | Admitting: Orthopedic Surgery

## 2022-08-27 DIAGNOSIS — S46001D Unspecified injury of muscle(s) and tendon(s) of the rotator cuff of right shoulder, subsequent encounter: Secondary | ICD-10-CM

## 2022-08-27 DIAGNOSIS — M25311 Other instability, right shoulder: Secondary | ICD-10-CM

## 2022-08-27 DIAGNOSIS — G8929 Other chronic pain: Secondary | ICD-10-CM

## 2022-08-31 ENCOUNTER — Ambulatory Visit
Admission: RE | Admit: 2022-08-31 | Discharge: 2022-08-31 | Disposition: A | Discharge status: Home / Self Care | Payer: Medicare Other | Source: Ambulatory Visit | Attending: Nurse Practitioner | Admitting: Nurse Practitioner

## 2022-08-31 DIAGNOSIS — Z1231 Encounter for screening mammogram for malignant neoplasm of breast: Secondary | ICD-10-CM | POA: Diagnosis present

## 2022-09-15 ENCOUNTER — Ambulatory Visit
Admission: RE | Admit: 2022-09-15 | Discharge: 2022-09-15 | Disposition: A | Discharge status: Home / Self Care | Payer: Medicare Other | Source: Ambulatory Visit | Attending: Orthopedic Surgery | Admitting: Orthopedic Surgery

## 2022-09-15 DIAGNOSIS — S46001D Unspecified injury of muscle(s) and tendon(s) of the rotator cuff of right shoulder, subsequent encounter: Secondary | ICD-10-CM

## 2022-09-15 DIAGNOSIS — G8929 Other chronic pain: Secondary | ICD-10-CM

## 2022-09-15 DIAGNOSIS — M25311 Other instability, right shoulder: Secondary | ICD-10-CM

## 2022-10-06 ENCOUNTER — Other Ambulatory Visit: Payer: Self-pay | Admitting: Orthopedic Surgery

## 2022-12-31 ENCOUNTER — Encounter
Admission: RE | Admit: 2022-12-31 | Discharge: 2022-12-31 | Disposition: A | Discharge status: Home / Self Care | Payer: Medicare Other | Source: Ambulatory Visit | Attending: Orthopedic Surgery | Admitting: Orthopedic Surgery

## 2022-12-31 ENCOUNTER — Other Ambulatory Visit: Payer: Self-pay

## 2022-12-31 ENCOUNTER — Other Ambulatory Visit: Payer: Medicare Other

## 2022-12-31 VITALS — Ht 63.5 in | Wt 110.0 lb

## 2022-12-31 DIAGNOSIS — Z79899 Other long term (current) drug therapy: Secondary | ICD-10-CM

## 2022-12-31 DIAGNOSIS — I10 Essential (primary) hypertension: Secondary | ICD-10-CM

## 2022-12-31 HISTORY — DX: Prediabetes: R73.03

## 2022-12-31 NOTE — Patient Instructions (Signed)
Your procedure is scheduled on: 01/07/23 Report to DAY SURGERY DEPARTMENT LOCATED ON 2ND FLOOR MEDICAL MALL ENTRANCE. To find out your arrival time please call [phone] between 1PM - 3PM on 01/06/23.  Remember: Instructions that are not followed completely may result in serious medical risk, up to and including death, or upon the discretion of your surgeon and anesthesiologist your surgery may need to be rescheduled.     _X__ 1. Do not eat food after midnight the night before your procedure.                 No gum chewing or hard candies. You may drink clear liquids up to 2 hours                 before you are scheduled to arrive for your surgery- DO not drink clear                 liquids within 2 hours of the start of your surgery.                 Clear Liquids include:  water, apple juice without pulp, clear carbohydrate                 drink such as Clearfast or Gatorade, Black Coffee or Tea (Do not add                 anything to coffee or tea). Diabetics water only  Drink the Ensure "clear" pre surgery drink 2 hours before arrival to surgery.  __X__2.  On the morning of surgery brush your teeth with toothpaste and water, you                 may rinse your mouth with mouthwash if you wish.  Do not swallow any              toothpaste of mouthwash.     _X__ 3.  No Alcohol for 24 hours before or after surgery.   _X__ 4.  Do Not Smoke or use e-cigarettes For 24 Hours Prior to Your Surgery.                 Do not use any chewable tobacco products for at least 6 hours prior to                 surgery.  ____  5.  Bring all medications with you on the day of surgery if instructed.   __X__  6.  Notify your doctor if there is any change in your medical condition      (cold, fever, infections).     Do not wear jewelry, make-up, hairpins, clips or nail polish. Do not wear lotions, powders, or perfumes. No Deodorant Do not shave body hair 48 hours prior to surgery. Men may shave face and  neck. Do not bring valuables to the hospital.    [facility] is not responsible for any belongings or valuables.  Contacts, dentures/partials or body piercings may not be worn into surgery. Bring a case for your contacts, glasses or hearing aids, a denture cup will be supplied. Leave your suitcase in the car. After surgery it may be brought to your room. For patients admitted to the hospital, discharge time is determined by your treatment team.   Patients discharged the day of surgery will need an adult driver. You will not be allowed to drive yourself home, take an uber, lyft or taxi. You may use  medical transportation such as [place] as long as you have an adult with you or waiting to assist you upon your arrival home. You must have an adult over 27 years old in the home with you for the first 24 hours after surgery   Please read over the following fact sheets that you were given:   CHG soap, Incentive Spirometer, Ensure  __X__ Take these medicines the morning of surgery with A SIP OF WATER:    1. cetirizine (ZYRTEC) 10 MG tablet   2.   3.   4.  5.  6.  ____ Fleet Enema (as directed)   __X__ Use CHG Soap/SAGE wipes as directed  ____ Use inhalers on the day of surgery  ____ Stop metformin/Janumet/Farxiga 2 days prior to surgery    ____ Take 1/2 of usual insulin dose the night before surgery. No insulin the morning          of surgery.   ____ Stop Blood Thinners Coumadin/Plavix/Xarelto/Pleta/Pradaxa/Eliquis/Effient/Aspirin  on   Or contact your Surgeon, Cardiologist or Medical Doctor regarding  ability to stop your blood thinners  __X__ Stop Anti-inflammatories 7 days before surgery such as Advil, Ibuprofen, Motrin,  BC or Goodies Powder, Naprosyn, Naproxen, Aleve, Aspirin    __X__ Stop all herbals and supplements, fish oil or vitamins  until after surgery.    ____ Bring C-Pap to the hospital.    Hold Asprin starting today 12/31/22. If already taken today start  Saturday 01/01/23.      Preparing for Surgery with CHLORHEXIDINE GLUCONATE (CHG) Soap  Chlorhexidine Gluconate (CHG) Soap  o An antiseptic cleaner that kills germs and bonds with the skin to continue killing germs even after washing  o Used for showering the night before surgery and morning of surgery  Before surgery, you can play an important role by reducing the number of germs on your skin.  CHG (Chlorhexidine gluconate) soap is an antiseptic cleanser which kills germs and bonds with the skin to continue killing germs even after washing.  Please do not use if you have an allergy to CHG or antibacterial soaps. If your skin becomes reddened/irritated stop using the CHG.  1. Shower the NIGHT BEFORE SURGERY and the MORNING OF SURGERY with CHG soap.  2. If you choose to wash your hair, wash your hair first as usual with your normal shampoo.  3. After shampooing, rinse your hair and body thoroughly to remove the shampoo.  4. Use CHG as you would any other liquid soap. You can apply CHG directly to the skin and wash gently with a scrungie or a clean washcloth.  5. Apply the CHG soap to your body only from the neck down. Do not use on open wounds or open sores. Avoid contact with your eyes, ears, mouth, and genitals (private parts). Wash face and genitals (private parts) with your normal soap.  6. Wash thoroughly, paying special attention to the area where your surgery will be performed.  7. Thoroughly rinse your body with warm water.  8. Do not shower/wash with your normal soap after using and rinsing off the CHG soap.  9. Pat yourself dry with a clean towel.  10. Wear clean pajamas to bed the night before surgery.  12. Place clean sheets on your bed the night of your first shower and do not sleep with pets.  13. Shower again with the CHG soap on the day of surgery prior to arriving at the hospital.  14. Do not apply any  deodorants/lotions/powders.  15. Please wear clean  clothes to the hospital.

## 2023-01-03 ENCOUNTER — Encounter
Admission: RE | Admit: 2023-01-03 | Discharge: 2023-01-03 | Disposition: A | Discharge status: Home / Self Care | Payer: Medicare Other | Source: Ambulatory Visit | Attending: Orthopedic Surgery | Admitting: Orthopedic Surgery

## 2023-01-03 ENCOUNTER — Encounter: Payer: Self-pay | Admitting: Urgent Care

## 2023-01-03 DIAGNOSIS — Z79899 Other long term (current) drug therapy: Secondary | ICD-10-CM | POA: Insufficient documentation

## 2023-01-03 DIAGNOSIS — I10 Essential (primary) hypertension: Secondary | ICD-10-CM | POA: Diagnosis not present

## 2023-01-03 DIAGNOSIS — I517 Cardiomegaly: Secondary | ICD-10-CM | POA: Diagnosis not present

## 2023-01-03 DIAGNOSIS — Z01818 Encounter for other preprocedural examination: Secondary | ICD-10-CM | POA: Diagnosis present

## 2023-01-03 LAB — BASIC METABOLIC PANEL
Anion gap: 8 (ref 5–15)
BUN: 18 mg/dL (ref 8–23)
CO2: 26 mmol/L (ref 22–32)
Calcium: 8.9 mg/dL (ref 8.9–10.3)
Chloride: 103 mmol/L (ref 98–111)
Creatinine, Ser: 0.83 mg/dL (ref 0.44–1.00)
GFR, Estimated: >60 mL/min (ref >60)
Glucose, Bld: 102 mg/dL — ABNORMAL HIGH (ref 70–99)
Potassium: 3.5 mmol/L (ref 3.5–5.1)
Sodium: 137 mmol/L (ref 135–145)

## 2023-01-03 LAB — CBC
HCT: 40.3 % (ref 36.0–46.0)
Hemoglobin: 13.5 g/dL (ref 12.0–15.0)
MCH: 30 pg (ref 26.0–34.0)
MCHC: 33.5 g/dL (ref 30.0–36.0)
MCV: 89.6 fL (ref 80.0–100.0)
Platelets: 460 10*3/uL — ABNORMAL HIGH (ref 150–400)
RBC: 4.5 MIL/uL (ref 3.87–5.11)
RDW: 13.1 % (ref 11.5–15.5)
WBC: 6.6 10*3/uL (ref 4.0–10.5)
nRBC: 0 % (ref 0.0–0.2)

## 2023-01-06 MED ORDER — ORAL CARE MOUTH RINSE: 15.0000 mL | Freq: Once | OROMUCOSAL | Status: AC

## 2023-01-06 MED ORDER — LACTATED RINGERS IV SOLN: INTRAVENOUS | Status: DC

## 2023-01-06 MED ORDER — CHLORHEXIDINE GLUCONATE 0.12 % MT SOLN: 15.0000 mL | Freq: Once | OROMUCOSAL | Status: AC

## 2023-01-06 MED ORDER — FAMOTIDINE 20 MG PO TABS: 20.0000 mg | ORAL_TABLET | Freq: Once | ORAL | Status: AC

## 2023-01-06 MED ORDER — CEFAZOLIN SODIUM-DEXTROSE 2-4 GM/100ML-% IV SOLN
2.0000 g | INTRAVENOUS | Status: AC
  Administered 2023-01-07: 2 g via INTRAVENOUS

## 2023-01-07 ENCOUNTER — Other Ambulatory Visit: Payer: Self-pay

## 2023-01-07 ENCOUNTER — Encounter
Admission: RE | Disposition: A | Discharge status: Home / Self Care | Payer: Self-pay | Source: Home / Self Care | Attending: Orthopedic Surgery

## 2023-01-07 ENCOUNTER — Ambulatory Visit: Payer: Medicare Other | Admitting: Certified Registered"

## 2023-01-07 ENCOUNTER — Ambulatory Visit
Admission: RE | Admit: 2023-01-07 | Discharge: 2023-01-07 | Disposition: A | Discharge status: Home / Self Care | Payer: Medicare Other | Attending: Orthopedic Surgery | Admitting: Orthopedic Surgery

## 2023-01-07 ENCOUNTER — Encounter: Payer: Self-pay | Admitting: Orthopedic Surgery

## 2023-01-07 ENCOUNTER — Ambulatory Visit: Payer: Medicare Other

## 2023-01-07 DIAGNOSIS — I10 Essential (primary) hypertension: Secondary | ICD-10-CM | POA: Insufficient documentation

## 2023-01-07 DIAGNOSIS — M75101 Unspecified rotator cuff tear or rupture of right shoulder, not specified as traumatic: Secondary | ICD-10-CM | POA: Insufficient documentation

## 2023-01-07 DIAGNOSIS — M19011 Primary osteoarthritis, right shoulder: Secondary | ICD-10-CM | POA: Diagnosis not present

## 2023-01-07 DIAGNOSIS — M25811 Other specified joint disorders, right shoulder: Secondary | ICD-10-CM | POA: Diagnosis present

## 2023-01-07 HISTORY — PX: SHOULDER ARTHROSCOPY WITH SUBACROMIAL DECOMPRESSION AND OPEN ROTATOR C: SHX5688

## 2023-01-07 SURGERY — SHOULDER ARTHROSCOPY WITH SUBACROMIAL DECOMPRESSION AND OPEN ROTATOR CUFF REPAIR, OPEN BICEPS TENDON REPAIR
Anesthesia: General | Site: Shoulder | Laterality: Right

## 2023-01-07 MED ORDER — ONDANSETRON HCL 4 MG/2ML IJ SOLN
INTRAMUSCULAR | Status: DC | PRN
  Administered 2023-01-07: 4 mg via INTRAVENOUS

## 2023-01-07 MED ORDER — SUGAMMADEX SODIUM 200 MG/2ML IV SOLN
INTRAVENOUS | Status: DC | PRN
  Administered 2023-01-07: 200 mg via INTRAVENOUS

## 2023-01-07 MED ORDER — ACETAMINOPHEN 500 MG PO TABS: 1000.0000 mg | ORAL_TABLET | Freq: Three times a day (TID) | ORAL | 2 refills | Status: AC

## 2023-01-07 MED ORDER — OXYCODONE HCL 5 MG PO TABS: 5.0000 mg | ORAL_TABLET | Freq: Once | ORAL | Status: DC | PRN

## 2023-01-07 MED ORDER — PROPOFOL 10 MG/ML IV BOLUS
INTRAVENOUS | Status: DC | PRN
  Administered 2023-01-07: 130 mg via INTRAVENOUS

## 2023-01-07 MED ORDER — BUPIVACAINE HCL (PF) 0.5 % IJ SOLN
INTRAMUSCULAR | Status: AC
  Filled 2023-01-07: qty 60

## 2023-01-07 MED ORDER — LACTATED RINGERS IR SOLN
Status: DC | PRN
  Administered 2023-01-07 (×8): 3000 mL

## 2023-01-07 MED ORDER — DEXAMETHASONE SODIUM PHOSPHATE 10 MG/ML IJ SOLN
INTRAMUSCULAR | Status: DC | PRN
  Administered 2023-01-07: 10 mg via INTRAVENOUS

## 2023-01-07 MED ORDER — LIDOCAINE HCL (PF) 1 % IJ SOLN
INTRAMUSCULAR | Status: AC
  Filled 2023-01-07: qty 5

## 2023-01-07 MED ORDER — OXYCODONE HCL 5 MG PO TABS: 5.0000 mg | ORAL_TABLET | ORAL | 0 refills | Status: AC | PRN

## 2023-01-07 MED ORDER — BUPIVACAINE LIPOSOME 1.3 % IJ SUSP
INTRAMUSCULAR | Status: AC
  Filled 2023-01-07: qty 10

## 2023-01-07 MED ORDER — EPINEPHRINE PF 1 MG/ML IJ SOLN
INTRAMUSCULAR | Status: AC
  Filled 2023-01-07: qty 8

## 2023-01-07 MED ORDER — LIDOCAINE HCL (PF) 2 % IJ SOLN
INTRAMUSCULAR | Status: AC
  Filled 2023-01-07: qty 5

## 2023-01-07 MED ORDER — CHLORHEXIDINE GLUCONATE 0.12 % MT SOLN
OROMUCOSAL | Status: AC
  Administered 2023-01-07: 15 mL via OROMUCOSAL
  Filled 2023-01-07: qty 15

## 2023-01-07 MED ORDER — PROPOFOL 10 MG/ML IV BOLUS
INTRAVENOUS | Status: AC
  Filled 2023-01-07: qty 20

## 2023-01-07 MED ORDER — LIDOCAINE HCL (CARDIAC) PF 100 MG/5ML IV SOSY
PREFILLED_SYRINGE | INTRAVENOUS | Status: DC | PRN
  Administered 2023-01-07: 60 mg via INTRAVENOUS

## 2023-01-07 MED ORDER — CEFAZOLIN SODIUM-DEXTROSE 2-4 GM/100ML-% IV SOLN
INTRAVENOUS | Status: AC
  Filled 2023-01-07: qty 100

## 2023-01-07 MED ORDER — PHENYLEPHRINE HCL-NACL 20-0.9 MG/250ML-% IV SOLN
INTRAVENOUS | Status: AC
  Filled 2023-01-07: qty 250

## 2023-01-07 MED ORDER — FENTANYL CITRATE (PF) 100 MCG/2ML IJ SOLN
INTRAMUSCULAR | Status: AC
  Filled 2023-01-07: qty 2

## 2023-01-07 MED ORDER — MIDAZOLAM HCL 2 MG/2ML IJ SOLN
INTRAMUSCULAR | Status: AC
  Administered 2023-01-07: 2 mg via INTRAVENOUS
  Filled 2023-01-07: qty 2

## 2023-01-07 MED ORDER — ONDANSETRON 4 MG PO TBDP: 4.0000 mg | ORAL_TABLET | Freq: Three times a day (TID) | ORAL | 0 refills | Status: AC | PRN

## 2023-01-07 MED ORDER — BUPIVACAINE LIPOSOME 1.3 % IJ SUSP
INTRAMUSCULAR | Status: DC | PRN
  Administered 2023-01-07: 10 mL

## 2023-01-07 MED ORDER — BUPIVACAINE HCL (PF) 0.5 % IJ SOLN
INTRAMUSCULAR | Status: DC | PRN
  Administered 2023-01-07: 10 mL

## 2023-01-07 MED ORDER — DEXAMETHASONE SODIUM PHOSPHATE 10 MG/ML IJ SOLN
INTRAMUSCULAR | Status: AC
  Filled 2023-01-07: qty 1

## 2023-01-07 MED ORDER — PHENYLEPHRINE HCL-NACL 20-0.9 MG/250ML-% IV SOLN
INTRAVENOUS | Status: DC | PRN
  Administered 2023-01-07: 25.467 ug/min via INTRAVENOUS

## 2023-01-07 MED ORDER — BUPIVACAINE HCL (PF) 0.5 % IJ SOLN
INTRAMUSCULAR | Status: AC
  Filled 2023-01-07: qty 10

## 2023-01-07 MED ORDER — ASPIRIN 325 MG PO TBEC: 325.0000 mg | DELAYED_RELEASE_TABLET | Freq: Every day | ORAL | 0 refills | Status: AC

## 2023-01-07 MED ORDER — LACTATED RINGERS IR SOLN
Status: DC | PRN
  Administered 2023-01-07 (×4): 3001 mL

## 2023-01-07 MED ORDER — MIDAZOLAM HCL 2 MG/2ML IJ SOLN: 2.0000 mg | Freq: Once | INTRAMUSCULAR | Status: AC

## 2023-01-07 MED ORDER — ACETAMINOPHEN 10 MG/ML IV SOLN
INTRAVENOUS | Status: DC | PRN
  Administered 2023-01-07: 1000 mg via INTRAVENOUS

## 2023-01-07 MED ORDER — HYDROMORPHONE HCL 1 MG/ML IJ SOLN: 0.2500 mg | INTRAMUSCULAR | Status: DC | PRN

## 2023-01-07 MED ORDER — ROCURONIUM BROMIDE 100 MG/10ML IV SOLN
INTRAVENOUS | Status: DC | PRN
  Administered 2023-01-07 (×2): 10 mg via INTRAVENOUS
  Administered 2023-01-07: 30 mg via INTRAVENOUS

## 2023-01-07 MED ORDER — FAMOTIDINE 20 MG PO TABS
ORAL_TABLET | ORAL | Status: AC
  Administered 2023-01-07: 20 mg via ORAL
  Filled 2023-01-07: qty 1

## 2023-01-07 MED ORDER — OXYCODONE HCL 5 MG/5ML PO SOLN: 5.0000 mg | Freq: Once | ORAL | Status: DC | PRN

## 2023-01-07 MED ORDER — ROCURONIUM BROMIDE 10 MG/ML (PF) SYRINGE
PREFILLED_SYRINGE | INTRAVENOUS | Status: AC
  Filled 2023-01-07: qty 10

## 2023-01-07 MED ORDER — FENTANYL CITRATE (PF) 100 MCG/2ML IJ SOLN
INTRAMUSCULAR | Status: DC | PRN
  Administered 2023-01-07: 50 ug via INTRAVENOUS

## 2023-01-07 MED ORDER — ACETAMINOPHEN 10 MG/ML IV SOLN
INTRAVENOUS | Status: AC
  Filled 2023-01-07: qty 100

## 2023-01-07 MED ORDER — LIDOCAINE HCL (PF) 1 % IJ SOLN
INTRAMUSCULAR | Status: DC | PRN
  Administered 2023-01-07: 3 mL

## 2023-01-07 MED ORDER — ONDANSETRON HCL 4 MG/2ML IJ SOLN
INTRAMUSCULAR | Status: AC
  Filled 2023-01-07: qty 2

## 2023-01-07 SURGICAL SUPPLY — 93 items
ADAPTER IRRIG TUBE 2 SPIKE SOL (ADAPTER) ×2 IMPLANT
ADH SKN CLS APL DERMABOND .7 (GAUZE/BANDAGES/DRESSINGS) ×1
ADPR TBG 2 SPK PMP STRL ASCP (ADAPTER) ×2
ANCH SUT .5 CRC TPR CT 40X40 (SUTURE) ×1
ANCH SUT 1.4 SUT TPE BLK/WHT (SUTURE) ×1
ANCH SUT 2 SWLK 19.1 CLS EYLT (Anchor) ×3 IMPLANT
ANCH SUT 2.9 PUSHLOCK ANCH (Orthopedic Implant) ×1 IMPLANT
ANCH SUT 2X2.3 TAPE (Anchor) ×2 IMPLANT
ANCH SUT SHRT 12.5 CANN EYLT (Anchor) ×1 IMPLANT
ANCHOR 2.3 SP SGL 1.2 XBRAID (Anchor) IMPLANT
ANCHOR SUT BIOCOMP LK 2.9X12.5 (Anchor) IMPLANT
ANCHOR SWIVELOCK BIO 4.75X19.1 (Anchor) ×1 IMPLANT
APL PRP STRL LF DISP 70% ISPRP (MISCELLANEOUS) ×1
BLADE SHAVER 4.5X7 STR FR (MISCELLANEOUS) ×1 IMPLANT
BNDG ADH 2 X3.75 FABRIC TAN LF (GAUZE/BANDAGES/DRESSINGS) ×1 IMPLANT
BNDG ADH XL 3.75X2 STRCH LF (GAUZE/BANDAGES/DRESSINGS) ×1
BUR BR 5.5 WIDE MOUTH (BURR) IMPLANT
CANNULA PART THRD DISP 5.75X7 (CANNULA) IMPLANT
CANNULA PARTIAL THREAD 2X7 (CANNULA) IMPLANT
CANNULA TWIST IN 8.25X7CM (CANNULA) IMPLANT
CANNULA TWIST IN 8.25X9CM (CANNULA) IMPLANT
CHLORAPREP W/TINT 26 (MISCELLANEOUS) ×1 IMPLANT
COOLER POLAR GLACIER W/PUMP (MISCELLANEOUS) ×1 IMPLANT
DERMABOND ADVANCED .7 DNX12 (GAUZE/BANDAGES/DRESSINGS) IMPLANT
DEVICE SUCT BLK HOLE OR FLOOR (MISCELLANEOUS) ×1 IMPLANT
DRAPE 3/4 80X56 (DRAPES) ×1 IMPLANT
DRAPE INCISE IOBAN 66X45 STRL (DRAPES) ×1 IMPLANT
DRAPE U-SHAPE 47X51 STRL (DRAPES) ×2 IMPLANT
DRSG TEGADERM 4X4.75 (GAUZE/BANDAGES/DRESSINGS) ×3 IMPLANT
ELECT REM PT RETURN 9FT ADLT (ELECTROSURGICAL) ×1
ELECTRODE REM PT RTRN 9FT ADLT (ELECTROSURGICAL) ×1 IMPLANT
GAUZE SPONGE 4X4 12PLY STRL (GAUZE/BANDAGES/DRESSINGS) ×1 IMPLANT
GAUZE XEROFORM 1X8 LF (GAUZE/BANDAGES/DRESSINGS) ×1 IMPLANT
GLOVE BIO SURGEON STRL SZ7.5 (GLOVE) ×1 IMPLANT
GLOVE BIOGEL PI IND STRL 8 (GLOVE) ×2 IMPLANT
GLOVE SURG ORTHO 8.0 STRL STRW (GLOVE) ×1 IMPLANT
GLOVE SURG SYN 7.5  E (GLOVE) ×1
GLOVE SURG SYN 7.5 E (GLOVE) ×1 IMPLANT
GLOVE SURG SYN 7.5 PF PI (GLOVE) IMPLANT
GLOVE SURG SYN 8.0 (GLOVE) ×1 IMPLANT
GLOVE SURG SYN 8.0 PF PI (GLOVE) ×1 IMPLANT
GOWN STRL REUS W/ TWL LRG LVL3 (GOWN DISPOSABLE) ×2 IMPLANT
GOWN STRL REUS W/TWL LRG LVL3 (GOWN DISPOSABLE) ×3
GOWN STRL REUS W/TWL XL LVL4 (GOWN DISPOSABLE) ×1 IMPLANT
IV LACTATED RINGER IRRG 3000ML (IV SOLUTION) ×12
IV LR IRRIG 3000ML ARTHROMATIC (IV SOLUTION) ×4 IMPLANT
KIT CORKSCREW KNTLS 3.9 S/T/P (INSTRUMENTS) IMPLANT
KIT STABILIZATION SHOULDER (MISCELLANEOUS) ×1 IMPLANT
KIT SUTURETAK 3.0 INSERT PERC (KITS) IMPLANT
KIT TURNOVER KIT A (KITS) ×1 IMPLANT
MANIFOLD NEPTUNE II (INSTRUMENTS) ×2 IMPLANT
MASK FACE SPIDER DISP (MASK) ×1 IMPLANT
MAT ABSORB  FLUID 56X50 GRAY (MISCELLANEOUS) ×2
MAT ABSORB FLUID 56X50 GRAY (MISCELLANEOUS) ×2 IMPLANT
NDL MAYO CATGUT SZ5 (NEEDLE)
NDL SAFETY ECLIP 18X1.5 (MISCELLANEOUS) ×1 IMPLANT
NDL SCORPION MULTI FIRE (NEEDLE) IMPLANT
NDL SUT 5 .5 CRC TPR PNT MAYO (NEEDLE) IMPLANT
NEEDLE SCORPION MULTI FIRE (NEEDLE) IMPLANT
PACK ARTHROSCOPY SHOULDER (MISCELLANEOUS) ×1 IMPLANT
PAD ABD DERMACEA PRESS 5X9 (GAUZE/BANDAGES/DRESSINGS) IMPLANT
PAD ARMBOARD 7.5X6 YLW CONV (MISCELLANEOUS) ×1 IMPLANT
PAD WRAPON POLAR SHDR XLG (MISCELLANEOUS) ×1 IMPLANT
PASSER SUT FIRSTPASS SELF (INSTRUMENTS) ×1 IMPLANT
PASSER SUT SWIFTSTITCH HIP CRT (INSTRUMENTS) ×1 IMPLANT
SHAVER BLADE BONE CUTTER  5.5 (BLADE)
SHAVER BLADE BONE CUTTER 5.5 (BLADE) ×1 IMPLANT
SLEEVE REMOTE CONTROL 5X12 (DRAPES) IMPLANT
SLING ULTRA II M (MISCELLANEOUS) ×1 IMPLANT
SPONGE T-LAP 18X18 ~~LOC~~+RFID (SPONGE) ×1 IMPLANT
STAPLER SKIN PROX 35W (STAPLE) IMPLANT
STRAP SAFETY 5IN WIDE (MISCELLANEOUS) ×1 IMPLANT
SUT ETHILON 3-0 (SUTURE) ×1 IMPLANT
SUT LASSO 90 DEG CVD (SUTURE) IMPLANT
SUT LASSO 90 DEG SD STR (SUTURE) IMPLANT
SUT MNCRL 4-0 (SUTURE) ×1
SUT MNCRL 4-0 27XMFL (SUTURE) ×1
SUT PROLENE 0 CT 2 (SUTURE) IMPLANT
SUT VIC AB 0 CT1 36 (SUTURE) IMPLANT
SUT VIC AB 2-0 CT2 27 (SUTURE) IMPLANT
SUT XBRAID 1.4 BLK/WHT (SUTURE) IMPLANT
SUT XBRAID 1.4 BLUE (SUTURE) IMPLANT
SUTURE MNCRL 4-0 27XMF (SUTURE) IMPLANT
SYSTEM IMPL TENODESIS LNT 2.9 (Orthopedic Implant) IMPLANT
TAPE CLOTH 3X10 WHT NS LF (GAUZE/BANDAGES/DRESSINGS) ×1 IMPLANT
TAPE MICROFOAM 4IN (TAPE) ×1 IMPLANT
TRAP FLUID SMOKE EVACUATOR (MISCELLANEOUS) ×1 IMPLANT
TUBING CONNECTING 10 (TUBING) IMPLANT
TUBING INFLOW SET DBFLO PUMP (TUBING) ×1 IMPLANT
TUBING OUTFLOW SET DBLFO PUMP (TUBING) ×1 IMPLANT
WAND WEREWOLF FLOW 90D (MISCELLANEOUS) ×1 IMPLANT
WATER STERILE IRR 500ML POUR (IV SOLUTION) ×1 IMPLANT
WRAPON POLAR PAD SHDR XLG (MISCELLANEOUS) ×1

## 2023-01-07 NOTE — Transfer of Care (Signed)
Immediate Anesthesia Transfer of Care Note  Patient: Jamie Griffith  Procedure(s) Performed: Right shoulder arthroscopic rotator cuff repair (supraspinatus and subscapularis), distal clavicle excision, and biceps tenodesis (Right: Shoulder)  Patient Location: PACU  Anesthesia Type:General  Level of Consciousness: awake, alert , and drowsy  Airway & Oxygen Therapy: Patient Spontanous Breathing and Patient connected to face mask oxygen  Post-op Assessment: Report given to RN and Post -op Vital signs reviewed and stable  Post vital signs: stable  Last Vitals:  Vitals Value Taken Time  BP 142/80 01/07/23 1021  Temp    Pulse 71 01/07/23 1025  Resp 18 01/07/23 1025  SpO2 100 % 01/07/23 1025  Vitals shown include unvalidated device data.  Last Pain:  Vitals:   01/07/23 0613  TempSrc: Temporal  PainSc: 4       Patients Stated Pain Goal: 0 (74/08/14 4818)  Complications: No notable events documented.

## 2023-01-07 NOTE — H&P (Signed)
Paper H&P to be scanned into permanent record. H&P reviewed. No significant changes noted.  

## 2023-01-07 NOTE — Discharge Instructions (Addendum)
POLAR CARE INFORMATION  [email or web address]  How to use [facility] Cold Therapy System?  YouTube   [email or web address]  OPERATING INSTRUCTIONS  Start the product With dry hands, connect the transformer to the electrical connection located on the top of the cooler. Next, plug the transformer into an appropriate electrical outlet. The unit will automatically start running at this point.  To stop the pump, disconnect electrical power.  Unplug to stop the product when not in use. Unplugging the Polar Care unit turns it off. Always unplug immediately after use. Never leave it plugged in while unattended. Remove pad.    FIRST ADD WATER TO FILL LINE, THEN ICE---Replace ice when existing ice is almost melted  1 Discuss Treatment with your [facility] Practitioner and Use Only as Prescribed 2 Apply Insulation Barrier & Cold Therapy Pad 3 Check for Moisture 4 Inspect Skin Regularly  Tips and Trouble Shooting Usage Tips 1. Use cubed or chunked ice for optimal performance. 2. It is recommended to drain the Pad between uses. To drain the pad, hold the Pad upright with the hose pointed toward the ground. Depress the black plunger and allow water to drain out. 3. You may disconnect the Pad from the unit without removing the pad from the affected area by depressing the silver tabs on the hose coupling and gently pulling the hoses apart. The Pad and unit will seal itself and will not leak. Note: Some dripping during release is normal. 4. DO NOT RUN PUMP WITHOUT WATER! The pump in this unit is designed to run with water. Running the unit without water will cause permanent damage to the pump. 5. Unplug unit before removing lid.  TROUBLESHOOTING GUIDE Pump not running, Water not flowing to the pad, Pad is not getting cold 1. Make sure the transformer is plugged into the wall outlet. 2. Confirm that the ice and water are filled to the indicated levels. 3. Make sure  there are no kinks in the pad. 4. Gently pull on the blue tube to make sure the tube/pad junction is straight. 5. Remove the pad from the treatment site and ll it while the pad is lying at; then reapply. 6. Confirm that the pad couplings are securely attached to the unit. Listen for the double clicks (Figure 1) to confirm the pad couplings are securely attached.  Leaks    Note: Some condensation on the lines, controller, and pads is unavoidable, especially in warmer climates. 1. If using a [facility] Polar Care Cold Therapy unit with a detachable Cold Therapy Pad, and a leak exists (other than condensation on the lines) disconnect the pad couplings. Make sure the silver tabs on the couplings are depressed before reconnecting the pad to the pump hose; then confirm both sides of the coupling are properly clicked in. 2. If the coupling continues to leak or a leak is detected in the pad itself, stop using it and call [facility] at [phone].  Cleaning After use, empty and dry the unit with a soft cloth. Warm water and mild detergent may be used occasionally to clean the pump and tubes.  WARNING: [place] can be cold enough to cause serious injury, including full skin necrosis. Follow these Operating Instructions, and carefully read the Product Insert (see pouch on side of unit) and the Cold Therapy Pad Fitting Instructions (provided with each Cold Therapy Pad) prior to use.       Post-Op Instructions - Rotator Cuff Repair  1.  Bracing: You will wear a shoulder immobilizer or sling for 6 weeks.   2. Driving: No driving for 3 weeks post-op. When driving, do not wear the immobilizer. Ideally, we recommend no driving for 6 weeks while sling is in place as one arm will be immobilized.   3. Activity: No active lifting for 2 months. Wrist, hand, and elbow motion only. Avoid lifting the upper arm away from the body except for hygiene. You are permitted to bend and straighten the elbow  passively only (no active elbow motion). You may use your hand and wrist for typing, writing, and managing utensils (cutting food). Do not lift more than a coffee cup for 8 weeks.  When sleeping or resting, inclined positions (recliner chair or wedge pillow) and a pillow under the forearm for support may provide better comfort for up to 4 weeks.  Avoid long distance travel for 4 weeks.  Return to normal activities after rotator cuff repair repair normally takes 6 months on average. If rehab goes very well, may be able to do most activities at 4 months, except overhead or contact sports.  4. Physical Therapy: Begins 3-4 days after surgery, and proceed 1 time per week for the first 6 weeks, then 1-2 times per week from weeks 6-20 post-op.  5. Medications:  - You will be provided a prescription for narcotic pain medicine. After surgery, take 1-2 narcotic tablets every 4 hours if needed for severe pain.  - A prescription for anti-nausea medication will be provided in case the narcotic medicine causes nausea - take 1 tablet every 6 hours only if nauseated.   - Take tylenol 1000 mg (2 Extra Strength tablets or 3 regular strength) every 8 hours for pain.  May decrease or stop tylenol 5 days after surgery if you are having minimal pain. - Take ASA 325mg /day x 2 weeks to help prevent DVTs/PEs (blood clots).  - DO NOT take ANY nonsteroidal anti-inflammatory pain medications (Advil, Motrin, Ibuprofen, Aleve, Naproxen, or Naprosyn). These medicines can inhibit healing of your shoulder repair.    If you are taking prescription medication for anxiety, depression, insomnia, muscle spasm, chronic pain, or for attention deficit disorder, you are advised that you are at a higher risk of adverse effects with use of narcotics post-op, including narcotic addiction/dependence, depressed breathing, death. If you use non-prescribed substances: alcohol, marijuana, cocaine, heroin, methamphetamines, etc., you are at a higher  risk of adverse effects with use of narcotics post-op, including narcotic addiction/dependence, depressed breathing, death. You are advised that taking > 50 morphine milligram equivalents (MME) of narcotic pain medication per day results in twice the risk of overdose or death. For your prescription provided: oxycodone 5 mg - taking more than 6 tablets per day would result in > 50 morphine milligram equivalents (MME) of narcotic pain medication. Be advised that we will prescribe narcotics short-term, for acute post-operative pain only - 3 weeks for major operations such as shoulder repair/reconstruction surgeries.     6. Post-Op Appointment:  Your first post-op appointment will be 10-14 days post-op.  7. Work or School: For most, but not all procedures, we advise staying out of work or school for at least 1 to 2 weeks in order to recover from the stress of surgery and to allow time for healing.   If you need a work or school note this can be provided.   8. Smoking: If you are a smoker, you need to refrain from smoking in the postoperative period. The nicotine  in cigarettes will inhibit healing of your shoulder repair and decrease the chance of successful repair. Similarly, nicotine containing products (gum, patches) should be avoided.   Post-operative Brace: Apply and remove the brace you received as you were instructed to at the time of fitting and as described in detail as the brace's instructions for use indicate.  Wear the brace for the period of time prescribed by your physician.  The brace can be cleaned with soap and water and allowed to air dry only.  Should the brace result in increased pain, decreased feeling (numbness/tingling), increased swelling or an overall worsening of your medical condition, please contact your doctor immediately.  If an emergency situation occurs as a result of wearing the brace after normal business hours, please dial 911 and seek immediate medical attention.  Let  your doctor know if you have any further questions about the brace issued to you. Refer to the shoulder sling instructions for use if you have any questions regarding the correct fit of your shoulder sling.  [facility] for Troubleshooting: [phone]  Video that illustrates how to properly use a shoulder sling: "Instructions for Proper Use of an Orthopaedic Sling" [email or web address]   AMBULATORY SURGERY  DISCHARGE INSTRUCTIONS   The drugs that you were given will stay in your system until tomorrow so for the next 24 hours you should not:  Drive an automobile Make any legal decisions Drink any alcoholic beverage   You may resume regular meals tomorrow.  Today it is better to start with liquids and gradually work up to solid foods.  You may eat anything you prefer, but it is better to start with liquids, then soup and crackers, and gradually work up to solid foods.   Please notify your doctor immediately if you have any unusual bleeding, trouble breathing, redness and pain at the surgery site, drainage, fever, or pain not relieved by medication.    Your post-operative visit with Dr.                                       is: Date:                        Time:    Please call to schedule your post-operative visit.  Additional Instructions: Interscalene Nerve Block (ISNB) Discharge Instructions    For your surgery you have received an Interscalene Nerve Block. Nerve Blocks affect many types of nerves, including nerves that control movement, pain and normal sensation.  You may experience feelings such as numbness, tingling, heaviness, weakness or the inability to move your arm or the feeling or sensation that your arm has "fallen asleep". A nerve block can last for 2 - 36 hours or more depending on the medication used.  Usually the weakness wears off first.  The tingling and heaviness usually wear off next.  Finally you may start to notice pain.   Keep in mind that this may occur in any order.  once a nerve block starts to wear off it is usually completely gone within 60 minutes. ISNB may cause mild shortness of breath, a hoarse voice, blurry vision, unequal pupils, or drooping of the face on the same side as the nerve block.  These symptoms will usually go away within 12 hours.  Very rarely the procedure itself can cause mild seizures. If needed, your surgeon will give you  a prescription for pain medication.  It will take about 60 minutes for the oral pain medication to become fully effective.  So, it is recommended that you start taking this medication before the nerve block first begins to wear off, or when you first begin to feel discomfort. Keep in mind that nerve blocks often wear off in the middle of the night.   If you are going to bed and the block has not started to wear off or you have not started to have any discomfort, consider setting an alarm for 2 to 3 hours, so you can assess your block.  If you notice the block is wearing off or you are starting to have discomfort, you can take your pain medication. Take your pain medication only as prescribed.  Pain medication can cause sedation and decrease your breathing if you take more than you need for the level of pain that you have. Nausea is a common side effect of many pain medications.  You may want to eat something before taking your pain medicine to prevent nausea. After an Interscalene nerve block, you cannot feel pain, pressure or extremes in temperature in the effected arm.  Because your arm is numb it is at an increased risk for injury.  To decrease the possibility of injury, please practice the following:  While you are awake change the position of your arm frequently to prevent too much pressure on any one area for prolonged periods of time.  If you have a cast or tight dressing, check the color or your fingers every couple of hours.  Call your surgeon with the appearance of any  discoloration (white or blue). If you are given a sling to wear before you go home, please wear it  at all times until the block has completely worn off.  Do not get up at night without your sling. If you experience any problems or concerns, please contact your surgeon's office. If you experience severe or prolonged shortness of breath go to the nearest emergency department.

## 2023-01-07 NOTE — Op Note (Addendum)
SURGERY DATE: 01/07/2023   PRE-OP DIAGNOSIS:  1. Right subacromial impingement 2. Right biceps tendinopathy 3. Right rotator cuff tear 4. Right acromioclavicular joint arthritis  POST-OP DIAGNOSIS: 1. Right subacromial impingement 2. Right biceps tendinopathy 3. Right rotator cuff tear 4. Right acromioclavicular joint arthritis  PROCEDURES:  1. Right arthroscopic rotator cuff repair (full thickness supraspinatus, partial thickness subscapularis) 2. Right arthroscopic biceps tenodesis 3. Right arthroscopic extensive debridement of shoulder (glenohumeral and subacromial spaces) 4. Right arthroscopic distal clavicle excision  SURGEON: Cato Mulligan, MD   ASSISTANT: Anitra Lauth, PA   ANESTHESIA: Gen with Exparel interscalene block   ESTIMATED BLOOD LOSS: 5cc   DRAINS:  none   TOTAL IV FLUIDS: per anesthesia      SPECIMENS: none   IMPLANTS:  - Arthrex 2.57mm PushLock x 2 - Arthrex 4.31mm SwiveLock x 3 - Iconix SPEED double loaded with 1.2 and 2.73mm tape x 2     OPERATIVE FINDINGS:  Examination under anesthesia: A careful examination under anesthesia was performed.  Passive range of motion was: FF: 150; ER at side: 50; ER in abduction: 95; IR in abduction: 45.  Anterior load shift: NT.  Posterior load shift: NT.  Sulcus in neutral: NT.  Sulcus in ER: NT.     Intra-operative findings: A thorough arthroscopic examination of the shoulder was performed.  The findings are: 1. Biceps tendon: tendinopathy with significant erythema  2. Superior labrum: erythema 3. Posterior labrum and capsule: normal 4. Inferior capsule and inferior recess: normal 5. Glenoid cartilage surface: Normal 6. Supraspinatus attachment: full-thickness tear of the supraspinatus 7. Posterior rotator cuff attachment: normal 8. Humeral head articular cartilage: normal 9. Rotator interval: significant synovitis 10: Subscapularis tendon: Partial-thickness tearing of the superior border and midportion 11.  Anterior labrum: Mildly degenerative 12. IGHL: normal   OPERATIVE REPORT:    Indications for procedure:  Jamie Griffith is a 67 y.o. female with over 6 months of right shoulder pain. She has had difficulty with overhead motion with sensations of weakness. Clinical exam and MRI were suggestive of rotator cuff tear, biceps tendinopathy, acromioclavicular joint arthritis and subacromial impingement. After discussion of risks, benefits, and alternatives to surgery, the patient elected to proceed.    Procedure in detail:   I identified Jamie Griffith in the pre-operative holding area.  I marked the operative shoulder with my initials. I reviewed the risks and benefits of the proposed surgical intervention, and the patient wished to proceed.  Anesthesia was then performed with an Exparel interscalene block.  The patient was transferred to the operative suite and placed in the beach chair position.     Appropriate IV antibiotics were administered prior to incision. The operative upper extremity was then prepped and draped in standard fashion. A time out was performed confirming the correct extremity, correct patient, and correct procedure.    I then created a standard posterior portal with an 11 blade. The glenohumeral joint was easily entered with a blunt trocar and the arthroscope introduced. The findings of diagnostic arthroscopy are described above. I debrided degenerative tissue including the synovitic tissue about the rotator interval and anterior and superior labrum. I then coagulated the inflamed synovium to obtain hemostasis and reduce the risk of post-operative swelling using an Arthrocare radiofrequency device.   I then turned my attention to the arthroscopic biceps tenodesis. The Loop n Tack technique was used to pass a FiberTape through the biceps in a locked fashion adjacent to the biceps anchor.  A hole for  a 2.9 mm Arthrex PushLock was drilled in the bicipital groove just superior to the  subscapularis tendon insertion.  The biceps tendon was then cut and the biceps anchor complex was debrided down to a stable base on the superior labrum.  The FiberTape was loaded onto the PushLock anchor and impacted into place into the previously drilled hole in the bicipital groove.  This appropriately secured the biceps into the bicipital groove and took it off of tension.   The subscapularis tear was identified.  There was no significant retraction. The lesser tuberosity footprint was prepared with a combination of electrocautery and curette. 2 suture tapes were passed in a mattress fashion. All 4 strands of suture were then loaded onto a 4.75 mm SwiveLock anchor and placed into the prepared footprint with the arm in a neutral position.  The knotless mechanism of the anchor was then used to further reduce the upper border.  This construct appropriately reduced the subscapularis tear.  The arm was then internally and externally rotated and the subscapularis was noted to move appropriately with rotation.  The remainder of the suture was then cut.   Next, the arthroscope was then introduced into the subacromial space. A direct lateral portal was created with an 11-blade after spinal needle localization. An extensive subacromial bursectomy and debridement was performed using a combination of the shaver and Arthrocare wand.    I then turned my attention to the arthroscopic distal clavicle excision. I identified the acromioclavicular joint. Surrounding bursal tissue was debrided and the edges of the joint were identified. I used the 5.24mm barrel burr to remove the distal clavicle parallel to the edge of the acromion. I was able to fit two widths of the burr into the space between the distal clavicle and acromion, signifying that I had removed ~46mm of distal clavicle. This was confirmed by viewing anteriorly and introducing a probe with measuring marks from the lateral portal. Hemostasis was achieved with an  Arthrocare wand.   Next, I created an accessory posterolateral portal to assist with visualization and instrumentation.  I debrided the poor quality edges of the supraspinatus tendon.  This was a U-shaped tear of the supraspinatus.  I prepared the footprint using a burr to expose bleeding bone.     I then percutaneously placed 1 Iconix SPEED medial row anchor along the anterior portion of the tear at the articular margin. Another SPEED anchor was placed along the posterior portion of the tear at the articular margin. I then shuttled all 8 strands of tape through the rotator cuff just lateral to the musculotendinous junction using a FirstPass suture passer spanning the anterior to posterior extent of the tear. The posterior strands of each suture were passed through an [employer] anchor.  This was placed approximately 2 cm distal to the lateral edge of the footprint in line with the posterior aspect of the tear with appropriate tensioning of each suture prior to final fixation.  Similarly, the anterior strands of each suture were passed through another SwiveLock anchor along the anterior margin of the tear.  There was a small central dogear.  A suture tape was placed in a mattress fashion and this was loaded onto a push lock anchor that was then fixed laterally.  This construct allowed for excellent reapproximation of the rotator cuff to its native footprint without undue tension.  Appropriate compression was achieved.  The repair was stable to external and internal rotation.   Fluid was evacuated from the shoulder, and  the portals were closed with 3-0 Nylon. Xeroform was applied to the portals. A sterile dressing was applied, followed by a Polar Care sleeve and a SlingShot shoulder immobilizer/sling. The patient was awakened from anesthesia without difficulty and was transferred to the PACU in stable condition.    Of note, assistance from a PA was essential to performing the surgery.  PA was present  for the entire surgery.  PA assisted with patient positioning, retraction, instrumentation, and wound closure. The surgery would have been more difficult and had longer operative time without PA assistance.   COMPLICATIONS: none   DISPOSITION: plan for discharge home after recovery in PACU     POSTOPERATIVE PLAN: Remain in sling (except hygiene and elbow/wrist/hand RoM exercises as instructed by PT) x 6 weeks and NWB for this time. PT to begin 3-4 days after surgery.  Large rotator cuff repair rehab protocol with subscapularis restrictions. ASA 325mg  daily x 2 weeks for DVT ppx.

## 2023-01-07 NOTE — Anesthesia Procedure Notes (Signed)
Anesthesia Regional Block: Interscalene brachial plexus block   Pre-Anesthetic Checklist: , timeout performed,  Correct Patient, Correct Site, Correct Laterality,  Correct Procedure, Correct Position, site marked,  Risks and benefits discussed,  Surgical consent,  Pre-op evaluation,  At surgeon's request and post-op pain management  Laterality: Right  Prep: alcohol swabs       Needles:  Injection technique: Single-shot  Needle Type: Echogenic Needle     Needle Length: 4cm  Needle Gauge: 25     Additional Needles:   Procedures:,,,, ultrasound used (permanent image in chart),,    Narrative:  Start time: 01/07/2023 7:35 AM End time: 01/07/2023 7:37 AM Injection made incrementally with aspirations every 5 mL.  Performed by: Personally  Anesthesiologist: Ilene Qua, MD  Additional Notes: Patient's chart reviewed and they were deemed appropriate candidate for procedure, at surgeon's request. Patient educated about risks, benefits, and alternatives of the block including but not limited to: temporary or permanent nerve damage, bleeding, infection, damage to surround tissues, pneumothorax, hemidiaphragmatic paralysis, unilateral Horner's syndrome, block failure, local anesthetic toxicity. Patient expressed understanding. A formal time-out was conducted consistent with institution rules.  Monitors were applied, and minimal sedation used (see nursing record). The site was prepped with skin prep and allowed to dry, and sterile gloves were used. A high frequency linear ultrasound probe with probe cover was utilized throughout. C5-7 nerve roots located and appeared anatomically normal, local anesthetic injected around them, and echogenic block needle trajectory was monitored throughout. Aspiration performed every 29ml. Lung and blood vessels were avoided. All injections were performed without resistance and free of blood and paresthesias. The patient tolerated the procedure well.  Injectate:  72ml exparel + 71ml 0.5% bupivacaine

## 2023-01-07 NOTE — Anesthesia Preprocedure Evaluation (Addendum)
Anesthesia Evaluation  Patient identified by MRN, date of birth, ID band Patient awake    Reviewed: Allergy & Precautions, NPO status , Patient's Chart, lab work & pertinent test results  History of Anesthesia Complications Negative for: history of anesthetic complications  Airway Mallampati: II  TM Distance: >3 FB Neck ROM: full    Dental no notable dental hx.    Pulmonary neg pulmonary ROS   Pulmonary exam normal        Cardiovascular hypertension, On Medications Normal cardiovascular exam     Neuro/Psych negative neurological ROS  negative psych ROS   GI/Hepatic negative GI ROS, Neg liver ROS,,,  Endo/Other  negative endocrine ROS    Renal/GU      Musculoskeletal   Abdominal   Peds  Hematology negative hematology ROS (+)   Anesthesia Other Findings Past Medical History: No date: HTN (hypertension) No date: Pre-diabetes  Past Surgical History: No date: CESAREAN SECTION No date: COLONOSCOPY  BMI    Body Mass Index: 19.70 kg/m      Reproductive/Obstetrics negative OB ROS                             Anesthesia Physical Anesthesia Plan  ASA: 2  Anesthesia Plan: General   Post-op Pain Management: Toradol IV (intra-op)*, Ofirmev IV (intra-op)*, Dilaudid IV and Regional block*   Induction: Intravenous  PONV Risk Score and Plan: 3 and Dexamethasone, Ondansetron, Midazolam and Treatment may vary due to age or medical condition  Airway Management Planned: Oral ETT  Additional Equipment:   Intra-op Plan:   Post-operative Plan: Extubation in OR  Informed Consent: I have reviewed the patients History and Physical, chart, labs and discussed the procedure including the risks, benefits and alternatives for the proposed anesthesia with the patient or authorized representative who has indicated his/her understanding and acceptance.     Dental Advisory Given  Plan Discussed  with: Anesthesiologist, CRNA and Surgeon  Anesthesia Plan Comments: (Patient consented for risks of anesthesia including but not limited to:  - adverse reactions to medications - damage to eyes, teeth, lips or other oral mucosa - nerve damage due to positioning  - sore throat or hoarseness - Damage to heart, brain, nerves, lungs, other parts of body or loss of life  Patient voiced understanding.)        Anesthesia Quick Evaluation

## 2023-01-07 NOTE — Anesthesia Procedure Notes (Signed)
Procedure Name: Intubation Date/Time: 01/07/2023 7:54 AM  Performed by: Natasha Mead, CRNAPre-anesthesia Checklist: Patient identified, Emergency Drugs available, Suction available and Patient being monitored Patient Re-evaluated:Patient Re-evaluated prior to induction Oxygen Delivery Method: Circle system utilized Preoxygenation: Pre-oxygenation with 100% oxygen Induction Type: IV induction Ventilation: Mask ventilation without difficulty Laryngoscope Size: Miller and 2 Grade View: Grade I Tube type: Oral Tube size: 6.5 mm Number of attempts: 1 Airway Equipment and Method: Stylet and Oral airway Placement Confirmation: ETT inserted through vocal cords under direct vision, positive ETCO2 and breath sounds checked- equal and bilateral Secured at: 20 cm Tube secured with: Tape Dental Injury: Teeth and Oropharynx as per pre-operative assessment

## 2023-01-07 NOTE — Anesthesia Postprocedure Evaluation (Signed)
Anesthesia Post Note  Patient: Jamie Griffith  Procedure(s) Performed: Right shoulder arthroscopic rotator cuff repair (supraspinatus and subscapularis), distal clavicle excision, and biceps tenodesis (Right: Shoulder)  Patient location during evaluation: PACU Anesthesia Type: General Level of consciousness: awake and alert Pain management: pain level controlled Vital Signs Assessment: post-procedure vital signs reviewed and stable Respiratory status: spontaneous breathing, nonlabored ventilation, respiratory function stable and patient connected to nasal cannula oxygen Cardiovascular status: blood pressure returned to baseline and stable Postop Assessment: no apparent nausea or vomiting Anesthetic complications: no   No notable events documented.   Last Vitals:  Vitals:   01/07/23 1022 01/07/23 1030  BP: (!) 142/80 (!) 145/78  Pulse:  69  Resp: 16 18  Temp: (!) 36.2 C   SpO2: 100% 100%    Last Pain:  Vitals:   01/07/23 1030  TempSrc:   PainSc: 0-No pain                 Ilene Qua

## 2023-01-10 ENCOUNTER — Encounter: Payer: Self-pay | Admitting: Orthopedic Surgery

## 2023-09-21 ENCOUNTER — Other Ambulatory Visit: Payer: Self-pay | Admitting: Nurse Practitioner

## 2023-09-21 DIAGNOSIS — Z1231 Encounter for screening mammogram for malignant neoplasm of breast: Secondary | ICD-10-CM

## 2023-10-05 ENCOUNTER — Ambulatory Visit
Admission: RE | Admit: 2023-10-05 | Discharge: 2023-10-05 | Disposition: A | Discharge status: Home / Self Care | Payer: Medicare Other | Source: Ambulatory Visit | Attending: Nurse Practitioner | Admitting: Nurse Practitioner

## 2023-10-05 DIAGNOSIS — Z1231 Encounter for screening mammogram for malignant neoplasm of breast: Secondary | ICD-10-CM | POA: Insufficient documentation

## 2024-04-02 ENCOUNTER — Ambulatory Visit
Admission: EM | Admit: 2024-04-02 | Discharge: 2024-04-02 | Disposition: A | Discharge status: Home / Self Care | Attending: Emergency Medicine | Admitting: Emergency Medicine

## 2024-04-02 DIAGNOSIS — J309 Allergic rhinitis, unspecified: Secondary | ICD-10-CM | POA: Diagnosis not present

## 2024-04-02 DIAGNOSIS — R0982 Postnasal drip: Secondary | ICD-10-CM | POA: Diagnosis not present

## 2024-04-02 DIAGNOSIS — I10 Essential (primary) hypertension: Secondary | ICD-10-CM | POA: Insufficient documentation

## 2024-04-02 DIAGNOSIS — E785 Hyperlipidemia, unspecified: Secondary | ICD-10-CM | POA: Insufficient documentation

## 2024-04-02 DIAGNOSIS — K219 Gastro-esophageal reflux disease without esophagitis: Secondary | ICD-10-CM | POA: Insufficient documentation

## 2024-04-02 DIAGNOSIS — J069 Acute upper respiratory infection, unspecified: Secondary | ICD-10-CM | POA: Diagnosis not present

## 2024-04-02 DIAGNOSIS — R7302 Impaired glucose tolerance (oral): Secondary | ICD-10-CM | POA: Insufficient documentation

## 2024-04-02 MED ORDER — PROMETHAZINE-DM 6.25-15 MG/5ML PO SYRP: 5.0000 mL | ORAL_SOLUTION | Freq: Four times a day (QID) | ORAL | 0 refills | Status: AC | PRN

## 2024-04-02 MED ORDER — IPRATROPIUM BROMIDE 0.06 % NA SOLN: 2.0000 | Freq: Four times a day (QID) | NASAL | 12 refills | Status: AC

## 2024-04-02 MED ORDER — BENZONATATE 100 MG PO CAPS: 200.0000 mg | ORAL_CAPSULE | Freq: Three times a day (TID) | ORAL | 0 refills | Status: AC

## 2024-04-02 NOTE — Discharge Instructions (Addendum)
 Yes, I do believe you have a respiratory virus that is being compounded by your allergy symptoms.  Please continue to take your Zyrtec daily and use your Flonase at bedtime.  Add on the following medications to see if they help your symptoms.  Use the Atrovent nasal spray, 2 squirts in each nostril every 6 hours, as needed for runny nose and postnasal drip.  Use the Tessalon Perles every 8 hours during the day.  Take them with a small sip of water.  They may give you some numbness to the base of your tongue or a metallic taste in your mouth, this is normal.  Use the Promethazine DM cough syrup at bedtime for cough and congestion.  It will make you drowsy so do not take it during the day.  Return for reevaluation or see your primary care provider for any new or worsening symptoms.

## 2024-04-02 NOTE — ED Provider Notes (Signed)
 [facility]    CSN: [id number] Arrival date & time: 04/02/24  1226      History   Chief Complaint Chief Complaint  Patient presents with   Cough    HPI Jamie Griffith is a 68 y.o. female.   HPI  68 year old female with past medical history significant for hypertension, high cholesterol, GERD, and prediabetes presents for evaluation of 2 weeks worth of a cough.  She has had some mild nasal congestion and postnasal drip.  She reports that when she is able to bring up mucus it is clear in color.  She denies any fever, shortness breath, wheezing, sore throat, ear pain, or itchy eyes.  She does take Zyrtec and Flonase for allergies.  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      Home Medications    Prior to Admission medications   Medication Sig Start Date End Date Taking? Authorizing Provider  benzonatate (TESSALON) 100 MG capsule Take 2 capsules (200 mg total) by mouth every 8 (eight) hours. 04/02/24  Yes Becky Augusta, NP  ipratropium (ATROVENT) 0.06 % nasal spray Place 2 sprays into both nostrils 4 (four) times daily. 04/02/24  Yes Becky Augusta, NP  promethazine-dextromethorphan (PROMETHAZINE-DM) 6.25-15 MG/5ML syrup  Take 5 mLs by mouth 4 (four) times daily as needed. 04/02/24  Yes Becky Augusta, NP  atorvastatin (LIPITOR) 10 MG tablet Take 10 mg by mouth at bedtime.    [provider]  Calcium Carbonate-Vit D-Min (CALCIUM 1200 PO) Take 1 tablet by mouth daily.    [provider]  cetirizine (ZYRTEC) 10 MG tablet Take 10 mg by mouth daily.    [provider]  losartan-hydrochlorothiazide (HYZAAR) 100-25 MG tablet Take 1 tablet by mouth daily. 09/25/20   [provider]  Multiple Vitamin (MULTIVITAMIN) tablet Take 1 tablet by mouth daily.    [provider]  ondansetron (ZOFRAN-ODT) 4 MG disintegrating tablet Take 1 tablet (4 mg total) by mouth every 8 (eight) hours as needed for nausea or vomiting. 01/07/23   Signa Kell, MD    Family History Family History  Problem Relation Age of Onset   Breast cancer Neg Hx     Social History Social History   Tobacco Use   Smoking status: Never   Smokeless tobacco: Never  Vaping Use   Vaping status: Never Used  Substance Use Topics   Alcohol use: Yes    Comment: Occ, socially   Drug use: No     Allergies   Rosuvastatin   Review of Systems Review of Systems  Constitutional:  Negative for fever.  HENT:  Positive for congestion and postnasal drip. Negative for ear pain, rhinorrhea and sore throat.   Respiratory:  Positive for cough. Negative for shortness of breath and  wheezing.      Physical Exam Triage Vital Signs ED Triage Vitals  Encounter Vitals Group     BP      Systolic BP Percentile      Diastolic BP Percentile      Pulse      Resp      Temp      Temp src      SpO2      Weight      Height      Head Circumference      Peak Flow      Pain Score      Pain Loc      Pain Education      Exclude from Growth Chart    No data found.  Updated Vital Signs BP (!) 194/91 (BP Location: Right Arm)   Pulse 97   Temp 98.7 F (37.1 C) (Oral)   Resp 16   SpO2 96%   Visual Acuity Right Eye  Distance:   Left Eye Distance:   Bilateral Distance:    Right Eye Near:   Left Eye Near:    Bilateral Near:     Physical Exam Vitals and nursing note reviewed.  Constitutional:      Appearance: Normal appearance. She is not ill-appearing.  HENT:     Head: Normocephalic and atraumatic.     Right Ear: Tympanic membrane, ear canal and external ear normal. There is no impacted cerumen.     Left Ear: Tympanic membrane, ear canal and external ear normal. There is no impacted cerumen.     Nose: Congestion and rhinorrhea present.     Comments: Nasal mucosa is edematous and erythematous with scant clear discharge in both nares.    Mouth/Throat:     Mouth: Mucous membranes are moist.     Pharynx: Oropharynx is clear. No oropharyngeal exudate or posterior oropharyngeal erythema.     Comments: Clear postnasal drip visible in the posterior oropharynx.  No erythema or injection of tissues. Cardiovascular:     Rate and Rhythm: Normal rate and regular rhythm.     Pulses: Normal pulses.     Heart sounds: Normal heart sounds. No murmur heard.    No friction rub. No gallop.  Pulmonary:     Effort: Pulmonary effort is normal.     Breath sounds: Normal breath sounds. No wheezing, rhonchi or rales.  Musculoskeletal:     Cervical back: Normal range of motion and neck supple. No tenderness.  Lymphadenopathy:     Cervical: No cervical adenopathy.  Skin:    General: Skin is warm and dry.     Capillary Refill: Capillary refill takes less than 2 seconds.     Findings: No rash.  Neurological:     General: No focal deficit present.     Mental Status: She is alert and oriented to person, place, and time.      UC Treatments / Results  Labs (all labs ordered are listed, but only abnormal results are displayed) Labs Reviewed - No data to display  EKG   Radiology No results found.  Procedures Procedures (including critical care time)  Medications Ordered in UC Medications - No data to  display  Initial Impression / Assessment and Plan / UC Course  I have reviewed the triage vital signs and the nursing notes.  Pertinent labs & imaging results that were available during my care of the patient were reviewed by me and considered in my medical decision making (see chart  for details).   Patient is a pleasant, nontoxic-appearing 68 year old female presenting for evaluation of 2 weeks worth of cough as outlined in HPI above.  In the exam room if she speaks for too long it does trigger cough as does deep respiration.  She does have a history of allergies and she takes Zyrtec daily and uses Flonase at night.  She is continuing to have symptoms.  She has not had any fever and she denies any purulent nasal discharge or purulent sputum production.  Her physical exam does reveal inflammation of her upper respiratory tract as evidenced by inflamed nasal mucosa but her nasal discharge is clear.  She also has clear postnasal drip in the posterior oropharynx.  Her cardiopulmonary exam physical lung sounds in all fields.  I suspect that she has an upper respiratory viral infection that is being compounded by her allergies.  I will have her continue to use her Zyrtec and Flonase daily and will add on Atrovent nasal spray to help with the congestion and postnasal drip.  Also Tessalon Perles and Promethazine DM cough syrup for cough and congestion.  I do not feel she has an antibiotic at this time given that she has no purulent sputum production and she has been afebrile.   Final Clinical Impressions(s) / UC Diagnoses   Final diagnoses:  Viral URI with cough  Allergic rhinitis with postnasal drip     Discharge Instructions      Yes, I do believe you have a respiratory virus that is being compounded by your allergy symptoms.  Please continue to take your Zyrtec daily and use your Flonase at bedtime.  Add on the following medications to see if they help your symptoms.  Use the Atrovent nasal  spray, 2 squirts in each nostril every 6 hours, as needed for runny nose and postnasal drip.  Use the Tessalon Perles every 8 hours during the day.  Take them with a small sip of water.  They may give you some numbness to the base of your tongue or a metallic taste in your mouth, this is normal.  Use the Promethazine DM cough syrup at bedtime for cough and congestion.  It will make you drowsy so do not take it during the day.  Return for reevaluation or see your primary care provider for any new or worsening symptoms.      ED Prescriptions     Medication Sig Dispense Auth. Provider   benzonatate (TESSALON) 100 MG capsule Take 2 capsules (200 mg total) by mouth every 8 (eight) hours. 21 capsule Kent Pear, NP   ipratropium (ATROVENT) 0.06 % nasal spray Place 2 sprays into both nostrils 4 (four) times daily. 15 mL Kent Pear, NP   promethazine-dextromethorphan (PROMETHAZINE-DM) 6.25-15 MG/5ML syrup Take 5 mLs by mouth 4 (four) times daily as needed. 118 mL Kent Pear, NP      PDMP not reviewed this encounter.   Kent Pear, NP 04/02/24 1252

## 2024-04-02 NOTE — ED Triage Notes (Signed)
 Patient presents to UC for cough x 14 days. Treating cough with nyquil.   Denies fever or SOB.

## 2024-08-03 ENCOUNTER — Encounter: Payer: Self-pay | Admitting: Gastroenterology

## 2024-08-13 ENCOUNTER — Ambulatory Visit: Payer: Self-pay | Admitting: Anesthesiology

## 2024-08-13 ENCOUNTER — Other Ambulatory Visit: Payer: Self-pay

## 2024-08-13 ENCOUNTER — Encounter: Payer: Self-pay | Admitting: Gastroenterology

## 2024-08-13 ENCOUNTER — Encounter
Admission: RE | Disposition: A | Discharge status: Home / Self Care | Payer: Self-pay | Source: Home / Self Care | Attending: Gastroenterology

## 2024-08-13 ENCOUNTER — Ambulatory Visit
Admission: RE | Admit: 2024-08-13 | Discharge: 2024-08-13 | Disposition: A | Discharge status: Home / Self Care | Attending: Gastroenterology | Admitting: Gastroenterology

## 2024-08-13 DIAGNOSIS — Z1211 Encounter for screening for malignant neoplasm of colon: Secondary | ICD-10-CM | POA: Insufficient documentation

## 2024-08-13 HISTORY — PX: COLONOSCOPY: SHX5424

## 2024-08-13 HISTORY — PX: POLYPECTOMY: SHX149

## 2024-08-13 SURGERY — COLONOSCOPY
Anesthesia: General | Site: Rectum

## 2024-08-13 MED ORDER — PROPOFOL 1000 MG/100ML IV EMUL
INTRAVENOUS | Status: AC
Start: 2024-08-13 — End: 2024-08-13
  Filled 2024-08-13: qty 100

## 2024-08-13 MED ORDER — STERILE WATER FOR IRRIGATION IR SOLN
Status: DC | PRN
  Administered 2024-08-13: 500 mL

## 2024-08-13 MED ORDER — PROPOFOL 10 MG/ML IV BOLUS
INTRAVENOUS | Status: DC | PRN
  Administered 2024-08-13: 40 mg via INTRAVENOUS
  Administered 2024-08-13: 90 mg via INTRAVENOUS
  Administered 2024-08-13: 120 ug/kg/min via INTRAVENOUS

## 2024-08-13 MED ORDER — LACTATED RINGERS IV SOLN: INTRAVENOUS | Status: DC

## 2024-08-13 SURGICAL SUPPLY — 16 items
CLIP HMST 235XBRD CATH ROT (MISCELLANEOUS) IMPLANT
ELECTRODE REM PT RTRN 9FT ADLT (ELECTROSURGICAL) IMPLANT
FORCEPS BIOP RAD 4 LRG CAP 4 (CUTTING FORCEPS) IMPLANT
FORCEPS ESCP3.2XJMB 240X2.8X (MISCELLANEOUS) IMPLANT
GAUZE SPONGE 4X4 12PLY STRL (GAUZE/BANDAGES/DRESSINGS) IMPLANT
GOWN CVR UNV OPN BCK APRN NK (MISCELLANEOUS) ×2 IMPLANT
INJECTOR VARIJECT VIN23 (MISCELLANEOUS) IMPLANT
KIT DEFENDO VALVE AND CONN (KITS) IMPLANT
KIT PRC NS LF DISP ENDO (KITS) ×1 IMPLANT
MANIFOLD NEPTUNE II (INSTRUMENTS) ×1 IMPLANT
MARKER SPOT ENDO TATTOO 5ML (MISCELLANEOUS) IMPLANT
PROBE APC STR FIRE (PROBE) IMPLANT
RETRIEVER NET ROTH 2.5X230 LF (MISCELLANEOUS) IMPLANT
SNARE COLD EXACTO (MISCELLANEOUS) IMPLANT
TRAP ETRAP POLY (MISCELLANEOUS) IMPLANT
WATER STERILE IRR 250ML POUR (IV SOLUTION) ×1 IMPLANT

## 2024-08-13 NOTE — Transfer of Care (Signed)
 Immediate Anesthesia Transfer of Care Note  Patient: Jamie Griffith  Procedure(s) Performed: COLONOSCOPY (Rectum) POLYPECTOMY, INTESTINE (Rectum)  Patient Location: PACU  Anesthesia Type: General  Level of Consciousness: awake, alert  and patient cooperative  Airway and Oxygen Therapy: Patient Spontanous Breathing and Patient connected to supplemental oxygen  Post-op Assessment: Post-op Vital signs reviewed, Patient's Cardiovascular Status Stable, Respiratory Function Stable, Patent Airway and No signs of Nausea or vomiting  Post-op Vital Signs: Reviewed and stable  Complications: No notable events documented.

## 2024-08-13 NOTE — Anesthesia Preprocedure Evaluation (Signed)
 Anesthesia Evaluation  Patient identified by MRN, date of birth, ID band Patient awake    Reviewed: Allergy & Precautions, NPO status , Patient's Chart, lab work & pertinent test results  History of Anesthesia Complications Negative for: history of anesthetic complications  Airway Mallampati: II  TM Distance: >3 FB Neck ROM: full    Dental no notable dental hx.    Pulmonary neg pulmonary ROS   Pulmonary exam normal        Cardiovascular hypertension, On Medications Normal cardiovascular exam     Neuro/Psych negative neurological ROS  negative psych ROS   GI/Hepatic negative GI ROS, Neg liver ROS,,,  Endo/Other  negative endocrine ROS    Renal/GU      Musculoskeletal   Abdominal   Peds  Hematology negative hematology ROS (+)   Anesthesia Other Findings Past Medical History: No date: HTN (hypertension) No date: Pre-diabetes  Past Surgical History: No date: CESAREAN SECTION No date: COLONOSCOPY  BMI    Body Mass Index: 19.70 kg/m      Reproductive/Obstetrics negative OB ROS                              Anesthesia Physical Anesthesia Plan  ASA: 2  Anesthesia Plan: General   Post-op Pain Management: Minimal or no pain anticipated   Induction: Intravenous  PONV Risk Score and Plan: 2 and Propofol  infusion and TIVA  Airway Management Planned: Nasal Cannula  Additional Equipment: None  Intra-op Plan:   Post-operative Plan:   Informed Consent: I have reviewed the patients History and Physical, chart, labs and discussed the procedure including the risks, benefits and alternatives for the proposed anesthesia with the patient or authorized representative who has indicated his/her understanding and acceptance.     Dental advisory given  Plan Discussed with: CRNA and Surgeon  Anesthesia Plan Comments: (Discussed risks of anesthesia with patient, including possibility of  difficulty with spontaneous ventilation under anesthesia necessitating airway intervention, PONV, and rare risks such as cardiac or respiratory or neurological events, and allergic reactions. Discussed the role of CRNA in patient's perioperative care. Patient understands.)        Anesthesia Quick Evaluation

## 2024-08-13 NOTE — Anesthesia Postprocedure Evaluation (Signed)
 Anesthesia Post Note  Patient: Jamie Griffith  Procedure(s) Performed: COLONOSCOPY (Rectum) POLYPECTOMY, INTESTINE (Rectum)  Patient location during evaluation: PACU Anesthesia Type: General Level of consciousness: awake and alert Pain management: pain level controlled Vital Signs Assessment: post-procedure vital signs reviewed and stable Respiratory status: spontaneous breathing, nonlabored ventilation, respiratory function stable and patient connected to nasal cannula oxygen Cardiovascular status: blood pressure returned to baseline and stable Postop Assessment: no apparent nausea or vomiting Anesthetic complications: no   No notable events documented.   Last Vitals:  Vitals:   08/13/24 0815 08/13/24 0817  BP: 120/82 120/82  Pulse: 82 79  Resp: 13 14  Temp:  36.5 C  SpO2: 98% 98%    Last Pain:  Vitals:   08/13/24 0817  TempSrc:   PainSc: 0-No pain                 Debby Mines

## 2024-08-13 NOTE — H&P (Signed)
 Jamie JONELLE Brooklyn, MD [facility], DHIP [address]  [place], [place] [address]  Main: [phone] Fax:  [phone] Pager: [phone]   Primary Care Physician:  Don Lauraine Collar, NP Primary Gastroenterologist:  Dr. Corinn JONELLE Griffith  Pre-Procedure History & Physical: HPI:  Jamie Griffith is a 68 y.o. female is here for an colonoscopy.   Past Medical History:  Diagnosis Date   HTN (hypertension)    Pre-diabetes     Past Surgical History:  Procedure Laterality Date   CESAREAN SECTION     COLONOSCOPY     SHOULDER ARTHROSCOPY WITH SUBACROMIAL DECOMPRESSION AND OPEN ROTATOR C Right 01/07/2023   Procedure: Right shoulder arthroscopic rotator cuff repair (supraspinatus and subscapularis), distal clavicle excision, and biceps tenodesis;  Surgeon: Tobie Priest, MD;  Location: [facility] OR[facility];  Service: Orthopedics;  Laterality: Right;    Prior to Admission medications   Medication Sig Start Date End Date Taking? Authorizing Provider  atorvastatin (LIPITOR) 10 MG tablet Take 10 mg by mouth at bedtime.   Yes [provider]  Calcium Carbonate-Vit D-Min (CALCIUM 1200 PO) Take 1 tablet by mouth daily.   Yes [provider]  cetirizine (ZYRTEC) 10 MG tablet Take 10 mg by mouth daily.   Yes [provider]  ipratropium (ATROVENT ) 0.06 % nasal spray Place 2 sprays into both nostrils 4 (four) times daily. 04/02/24  Yes Bernardino Ditch, NP  losartan-hydrochlorothiazide (HYZAAR) 100-25 MG tablet Take 1 tablet by mouth daily. 09/25/20  Yes [provider]  Multiple Vitamin (MULTIVITAMIN) tablet Take 1 tablet by mouth daily.   Yes [provider]  ondansetron  (ZOFRAN -ODT) 4 MG disintegrating tablet Take 1 tablet (4 mg total) by mouth every 8 (eight) hours as needed for nausea or vomiting. 01/07/23  Yes Tobie Priest, MD  promethazine -dextromethorphan (PROMETHAZINE -DM) 6.25-15 MG/5ML syrup Take 5 mLs by mouth 4 (four) times daily as  needed. 04/02/24  Yes Bernardino Ditch, NP  benzonatate  (TESSALON ) 100 MG capsule Take 2 capsules (200 mg total) by mouth every 8 (eight) hours. 04/02/24   Bernardino Ditch, NP    Allergies as of 07/02/2024 - Review Complete 04/02/2024  Allergen Reaction Noted   Rosuvastatin Rash 01/01/2021    Family History  Problem Relation Age of Onset   Breast cancer Neg Hx     Social History   Socioeconomic History   Marital status: Married    Spouse name: Clem   Number of children: Not on file   Years of education: Not on file   Highest education level: Not on file  Occupational History   Not on file  Tobacco Use   Smoking status: Never   Smokeless tobacco: Never  Vaping Use   Vaping status: Never Used  Substance and Sexual Activity   Alcohol use: Yes    Comment: Occ, socially   Drug use: No   Sexual activity: Not on file  Other Topics Concern   Not on file  Social History Narrative   Not on file   Social Drivers of Health   Financial Resource Strain: Low Risk  (07/02/2024)   Received [facility]   Overall Financial Resource Strain (CARDIA)    Difficulty of Paying Living Expenses: Not hard at all  Food Insecurity: No Food Insecurity (07/02/2024)   Received from [facility]   Hunger Vital Sign    Within the past 12 months, you worried that your food would run out before you got the money to  buy more.: Never true    Within the past 12 months, the food you bought just didn't last and you didn't have money to get more.: Never true  Transportation Needs: No Transportation Needs (07/02/2024)   Received from [facility] - Transportation    In the past 12 months, has lack of transportation kept you from medical appointments or from getting medications?: No    Lack of Transportation (Non-Medical): No  Physical Activity: Sufficiently Active (03/06/2019)   Received from [facility]   Exercise Vital Sign     On average, how many days per week do you engage in moderate to strenuous exercise (like a brisk walk)?: 2 days    On average, how many minutes do you engage in exercise at this level?: 90 min  Stress: Not on file  Social Connections: Not on file  Intimate Partner Violence: Not on file    Review of Systems: See HPI, otherwise negative ROS  Physical Exam: BP (!) 147/77   Pulse 84   Temp 98.5 F (36.9 C) (Temporal)   Resp 11   Ht 5' 4 (1.626 m)   Wt 49.9 kg   SpO2 97%   BMI 18.88 kg/m  General:   Alert,  pleasant and cooperative in NAD Head:  Normocephalic and atraumatic. Neck:  Supple; no masses or thyromegaly. Lungs:  Clear throughout to auscultation.    Heart:  Regular rate and rhythm. Abdomen:  Soft, nontender and nondistended. Normal bowel sounds, without guarding, and without rebound.   Neurologic:  Alert and  oriented x4;  grossly normal neurologically.  Impression/Plan: Jamie Griffith is here for an colonoscopy to be performed for colon cancer screening  Risks, benefits, limitations, and alternatives regarding  colonoscopy have been reviewed with the patient.  Questions have been answered.  All parties agreeable.   Jamie Brooklyn, MD  08/13/2024, 7:38 AM

## 2024-08-13 NOTE — Op Note (Signed)
 [facility] Gastroenterology Patient Name: Jamie Griffith Procedure Date: 08/13/2024 7:19 AM MRN: [id number] Account #: [id number] Date of Birth: [birth year] Admit Type: Outpatient Age: 68 Room: [facility] OR ROOM 01 Gender: Female Note Status: Finalized Instrument Name: Peds Colonoscope [id number] Procedure:             Colonoscopy Indications:           Screening for colorectal malignant neoplasm, This is                         the patient's first colonoscopy Providers:             Corinn Jess Brooklyn MD, MD Referring MD:          Lauraine LOIS Leak (Referring MD) Medicines:             General Anesthesia Complications:         No immediate complications. Estimated blood loss: None. Procedure:             Pre-Anesthesia Assessment:                        - Prior to the procedure, a History and Physical was                         performed, and patient medications and allergies were                         reviewed. The patient is competent. The risks and                         benefits of the procedure and the sedation options and                         risks were discussed with the patient. All questions                         were answered and informed consent was obtained.                         Patient identification and proposed procedure were                         verified by the physician, the nurse, the                         anesthesiologist, the anesthetist and the technician                         in the pre-procedure area in the procedure room in the                         endoscopy suite. Mental Status Examination: alert and                         oriented. Airway Examination: normal oropharyngeal                         airway and neck mobility. Respiratory Examination:  clear to auscultation. CV Examination: normal.                         Prophylactic Antibiotics: The patient does not require                         prophylactic  antibiotics. Prior Anticoagulants: The                         patient has taken no anticoagulant or antiplatelet                         agents. ASA Grade Assessment: II - A patient with mild                         systemic disease. After reviewing the risks and                         benefits, the patient was deemed in satisfactory                         condition to undergo the procedure. The anesthesia                         plan was to use general anesthesia. Immediately prior                         to administration of medications, the patient was                         re-assessed for adequacy to receive sedatives. The                         heart rate, respiratory rate, oxygen saturations,                         blood pressure, adequacy of pulmonary ventilation, and                         response to care were monitored throughout the                         procedure. The physical status of the patient was                         re-assessed after the procedure.                        After obtaining informed consent, the colonoscope was                         passed under direct vision. Throughout the procedure,                         the patient's blood pressure, pulse, and oxygen                         saturations were monitored continuously. The  Colonoscope was introduced through the anus and                         advanced to the the cecum, identified by appendiceal                         orifice and ileocecal valve. The colonoscopy was                         performed without difficulty. The patient tolerated                         the procedure well. The quality of the bowel                         preparation was evaluated using the BBPS [facility] Bowel                         Preparation Scale) with scores of: Right Colon = 3,                         Transverse Colon = 3 and Left Colon = 3 (entire mucosa                         seen  well with no residual staining, small fragments                         of stool or opaque liquid). The total BBPS score                         equals 9. The ileocecal valve, appendiceal orifice,                         and rectum were photographed. Findings:      The perianal and digital rectal examinations were normal. Pertinent       negatives include normal sphincter tone and no palpable rectal lesions.      A 5 mm polyp was found in the sigmoid colon. The polyp was sessile. The       polyp was removed with a cold snare. Resection and retrieval were       complete.      The retroflexed view of the distal rectum and anal verge was normal and       showed no anal or rectal abnormalities. Impression:            - No specimens collected. Recommendation:        - Discharge patient to home (with escort).                        - Resume previous diet today.                        - Continue present medications.                        - Await pathology results.                        - Repeat  colonoscopy in 5 to 7 years for surveillance                         based on pathology results. Procedure Code(s):     --- Professional ---                        [phone], Colonoscopy, flexible; with removal of                         tumor(s), polyp(s), or other lesion(s) by snare                         technique Diagnosis Code(s):     --- Professional ---                        Z12.11, Encounter for screening for malignant neoplasm                         of colon CPT copyright 2022 [employer]. All rights reserved. The codes documented in this report are preliminary and upon coder review may  be revised to meet current compliance requirements. Dr. Corinn Brooklyn Corinn Jess Brooklyn MD, MD 08/13/2024 8:09:28 AM This report has been signed electronically. Number of Addenda: 0 Note Initiated On: 08/13/2024 7:19 AM Scope Withdrawal Time: 0 hours 9 minutes 33 seconds  Total Procedure  Duration: 0 hours 12 minutes 23 seconds  Estimated Blood Loss:  Estimated blood loss: none.      [facility]

## 2024-08-15 LAB — SURGICAL PATHOLOGY

## 2024-08-17 ENCOUNTER — Ambulatory Visit: Payer: Self-pay | Admitting: Gastroenterology

## 2024-08-17 NOTE — Progress Notes (Signed)
 Recommend surveillance colonoscopy in 5 years  RV

## 2024-11-19 ENCOUNTER — Other Ambulatory Visit: Payer: Self-pay | Admitting: Nurse Practitioner

## 2024-11-19 DIAGNOSIS — Z1231 Encounter for screening mammogram for malignant neoplasm of breast: Secondary | ICD-10-CM

## 2024-12-26 ENCOUNTER — Ambulatory Visit
Admission: RE | Admit: 2024-12-26 | Discharge: 2024-12-26 | Disposition: A | Discharge status: Home / Self Care | Source: Ambulatory Visit | Attending: Nurse Practitioner | Admitting: Nurse Practitioner

## 2024-12-26 DIAGNOSIS — Z1231 Encounter for screening mammogram for malignant neoplasm of breast: Secondary | ICD-10-CM | POA: Diagnosis present
